# Patient Record
Sex: Male | Born: 1945 | Race: White | Hispanic: No | Marital: Married | Smoking: Never smoker
Health system: Southern US, Community
[De-identification: ages and names within clinical notes are randomized; demographics above are authoritative.]

## PROBLEM LIST (undated history)

## (undated) DIAGNOSIS — R55 Syncope and collapse: Secondary | ICD-10-CM

## (undated) DIAGNOSIS — R972 Elevated prostate specific antigen [PSA]: Secondary | ICD-10-CM

## (undated) DIAGNOSIS — G20 Parkinson's disease: Secondary | ICD-10-CM

## (undated) DIAGNOSIS — N400 Enlarged prostate without lower urinary tract symptoms: Secondary | ICD-10-CM

## (undated) DIAGNOSIS — G20A1 Parkinson's disease without dyskinesia, without mention of fluctuations: Secondary | ICD-10-CM

## (undated) DIAGNOSIS — E785 Hyperlipidemia, unspecified: Secondary | ICD-10-CM

## (undated) DIAGNOSIS — Z974 Presence of external hearing-aid: Secondary | ICD-10-CM

## (undated) DIAGNOSIS — F419 Anxiety disorder, unspecified: Secondary | ICD-10-CM

## (undated) HISTORY — PX: KNEE SURGERY: SHX244

## (undated) HISTORY — PX: HERNIA REPAIR: SHX51

---

## 2014-07-25 ENCOUNTER — Encounter: Payer: Self-pay | Admitting: Urgent Care

## 2014-07-25 DIAGNOSIS — Y9289 Other specified places as the place of occurrence of the external cause: Secondary | ICD-10-CM | POA: Diagnosis not present

## 2014-07-25 DIAGNOSIS — G20 Parkinson's disease: Secondary | ICD-10-CM | POA: Diagnosis not present

## 2014-07-25 DIAGNOSIS — Y998 Other external cause status: Secondary | ICD-10-CM | POA: Insufficient documentation

## 2014-07-25 DIAGNOSIS — W1839XA Other fall on same level, initial encounter: Secondary | ICD-10-CM | POA: Diagnosis not present

## 2014-07-25 DIAGNOSIS — S81811A Laceration without foreign body, right lower leg, initial encounter: Secondary | ICD-10-CM | POA: Insufficient documentation

## 2014-07-25 DIAGNOSIS — Y9389 Activity, other specified: Secondary | ICD-10-CM | POA: Insufficient documentation

## 2014-07-25 NOTE — ED Notes (Signed)
Patient presents with laceration to RLE; approx 1" just inferior to lateral aspect of knee. Patient reports that he fell and hit leg - could not stop bleeding. Slight oozing upon arrival. Dressing applied in triage.

## 2014-07-26 ENCOUNTER — Emergency Department
Admission: EM | Admit: 2014-07-26 | Discharge: 2014-07-26 | Disposition: A | Discharge status: Home / Self Care | Payer: Medicare Other | Attending: Emergency Medicine | Admitting: Emergency Medicine

## 2014-07-26 DIAGNOSIS — S81811A Laceration without foreign body, right lower leg, initial encounter: Secondary | ICD-10-CM

## 2014-07-26 DIAGNOSIS — W19XXXA Unspecified fall, initial encounter: Secondary | ICD-10-CM

## 2014-07-26 DIAGNOSIS — IMO0002 Reserved for concepts with insufficient information to code with codable children: Secondary | ICD-10-CM

## 2014-07-26 HISTORY — DX: Parkinson's disease: G20

## 2014-07-26 HISTORY — DX: Parkinson's disease without dyskinesia, without mention of fluctuations: G20.A1

## 2014-07-26 HISTORY — DX: Benign prostatic hyperplasia without lower urinary tract symptoms: N40.0

## 2014-07-26 MED ORDER — LIDOCAINE HCL (PF) 1 % IJ SOLN
INTRAMUSCULAR | Status: AC
  Administered 2014-07-26: 5 mL
  Filled 2014-07-26: qty 5

## 2014-07-26 MED ORDER — BACITRACIN 50000 UNITS IM SOLR
1.0000 "application " | Freq: Once | INTRAMUSCULAR | Status: AC
  Administered 2014-07-26: 1

## 2014-07-26 MED ORDER — BACITRACIN ZINC 500 UNIT/GM EX OINT
TOPICAL_OINTMENT | CUTANEOUS | Status: AC
  Filled 2014-07-26: qty 0.9

## 2014-07-26 MED ORDER — LIDOCAINE HCL (PF) 1 % IJ SOLN
5.0000 mL | Freq: Once | INTRAMUSCULAR | Status: AC
  Administered 2014-07-26: 5 mL

## 2014-07-26 NOTE — Discharge Instructions (Signed)

## 2014-07-26 NOTE — ED Provider Notes (Signed)
[facility] Emergency Department Provider Note  ____________________________________________  Time seen: Approximately 330 AM  I have reviewed the triage vital signs and the nursing notes.   HISTORY  Chief Complaint Fall and Extremity Laceration    HPI Johnny Cole is a 69 y.o. male who fell at home tonight and developed a laceration on his leg. The patient reports that because he has Parkinson's he falls often. He reports that he is getting appropriate and able with something in his hand and he fell. He reports that the bowl and his hand cut his leg. The patient has no pain in his knee but reports that he came in because he could not get the bleeding to stop. The patient reports that he did not his head did not pass out has not had any difficulty walking.   Past Medical History  Diagnosis Date  . Parkinson's disease   . BPH (benign prostatic hyperplasia)     There are no active problems to display for this patient.   Past Surgical History  Procedure Laterality Date  . Hernia repair      No current outpatient prescriptions on file.  Allergies Review of patient's allergies indicates no known allergies.  No family history on file.  Social History History  Substance Use Topics  . Smoking status: Never Smoker   . Smokeless tobacco: Not on file  . Alcohol Use: No    Review of Systems Constitutional: No fever/chills Eyes: No visual changes. ENT: No sore throat. Cardiovascular: Denies chest pain. Respiratory: Denies shortness of breath. Gastrointestinal: No abdominal pain.  No nausea, no vomiting.  No diarrhea.  No constipation. Genitourinary: Negative for dysuria. Musculoskeletal: Right leg pain Skin: Laceration Neurological: Negative for headaches, focal weakness or numbness.  10-point ROS otherwise negative.  ____________________________________________   PHYSICAL EXAM:  VITAL SIGNS: ED Triage Vitals  Enc Vitals Group     BP  07/25/14 2314 130/107 mmHg     Pulse Rate 07/25/14 2314 65     Resp 07/25/14 2314 16     Temp 07/25/14 2314 98.2 F (36.8 C)     Temp Source 07/25/14 2314 Oral     SpO2 07/25/14 2314 96 %     Weight 07/25/14 2314 206 lb (93.441 kg)     Height 07/25/14 2314  (1.702 m)     Head Cir --      Peak Flow --      Pain Score 07/25/14 2315 0     Pain Loc --      Pain Edu? --      Excl. in GC? --     Constitutional: Alert and oriented. Well appearing and in no acute distress. Eyes: Conjunctivae are normal. Left pupil 4 mm right pupil 3 mm EOMI. Head: Atraumatic. Nose: No congestion/rhinnorhea. Mouth/Throat: Mucous membranes are moist.  Oropharynx non-erythematous. Cardiovascular: Normal rate, regular rhythm. Grossly normal heart sounds.  Good peripheral circulation. Respiratory: Normal respiratory effort.  No retractions. Lungs CTAB. Gastrointestinal: Soft and nontender. No distention. No abdominal bruits. No CVA tenderness. Genitourinary: Deferred Musculoskeletal: Laceration to right lower extremity on right lateral shin below the knee. The laceration is approximately 3-4 cm in length. There is some mild bleeding coming from the area. Neurologic:  Normal speech and language. No gross focal neurologic deficits are appreciated.  Skin:  Skin is warm, dry and intact. No rash noted. Psychiatric: Mood and affect are normal.   ____________________________________________   LABS (all labs ordered are listed, but only  abnormal results are displayed)  Labs Reviewed - No data to display ____________________________________________  EKG  none ____________________________________________  RADIOLOGY  none ____________________________________________   PROCEDURES  Procedure(s) performed: Please, see procedure note(s).  LACERATION REPAIR Performed by: Lucrezia EuropeWebster,  Brayn Eckstein P Authorized by: Lucrezia EuropeWebster,  Uzma Hellmer P Consent: Verbal consent obtained. Risks and benefits: risks, benefits and  alternatives were discussed Consent given by: patient Patient identity confirmed: provided demographic data Prepped and Draped in normal sterile fashion Wound explored  Laceration Location: right lower extremity  Laceration Length: 3.5cm  No Foreign Bodies seen or palpated  Anesthesia: local infiltration  Local anesthetic: lidocaine 1% without epinephrine  Anesthetic total: 2 ml  Irrigation method: syringe Amount of cleaning: standard  Skin closure: 4.0 prolene  Number of sutures: 5  Technique: simple interrupted  Patient tolerance: Patient tolerated the procedure well with no immediate complications.   Critical Care performed: No  ____________________________________________   INITIAL IMPRESSION / ASSESSMENT AND PLAN / ED COURSE  Pertinent labs & imaging results that were available during my care of the patient were reviewed by me and considered in my medical decision making (see chart for details).  This is a 69 year old male who comes in tonight with a fall and a laceration to his right lower extremity. I did repair the wound with some sutures. The bleeding is controlled the patient has no other pain. I will discharge to home to follow-up with his primary care physician. He should have the sutures removed in 7-10 days. The patient has been instructed on reasons to return and he has no questions at this time. ____________________________________________   FINAL CLINICAL IMPRESSION(S) / ED DIAGNOSES  Final diagnoses:  Laceration  Leg laceration, right, initial encounter  Fall, initial encounter      Rebecka ApleyAllison P Calista Crain, MD 07/26/14 [phone]

## 2014-07-27 MED FILL — Bacitracin Intramuscular For Soln 50000 Unit: INTRAMUSCULAR | Qty: 1 | Status: AC

## 2016-01-03 ENCOUNTER — Ambulatory Visit: Payer: Medicare Other

## 2016-01-03 ENCOUNTER — Ambulatory Visit
Admission: EM | Admit: 2016-01-03 | Discharge: 2016-01-03 | Disposition: A | Discharge status: Home / Self Care | Payer: Medicare Other | Attending: Family Medicine | Admitting: Family Medicine

## 2016-01-03 ENCOUNTER — Encounter: Payer: Self-pay | Admitting: *Deleted

## 2016-01-03 DIAGNOSIS — S51011A Laceration without foreign body of right elbow, initial encounter: Secondary | ICD-10-CM | POA: Diagnosis present

## 2016-01-03 DIAGNOSIS — S40021A Contusion of right upper arm, initial encounter: Secondary | ICD-10-CM

## 2016-01-03 DIAGNOSIS — S41111A Laceration without foreign body of right upper arm, initial encounter: Secondary | ICD-10-CM | POA: Diagnosis not present

## 2016-01-03 DIAGNOSIS — W19XXXA Unspecified fall, initial encounter: Secondary | ICD-10-CM | POA: Insufficient documentation

## 2016-01-03 DIAGNOSIS — S51811A Laceration without foreign body of right forearm, initial encounter: Secondary | ICD-10-CM | POA: Diagnosis not present

## 2016-01-03 MED ORDER — CEPHALEXIN 500 MG PO CAPS: 500.0000 mg | ORAL_CAPSULE | Freq: Three times a day (TID) | ORAL | 0 refills | Status: AC

## 2016-01-03 MED ORDER — LIDOCAINE HCL (PF) 1 % IJ SOLN: 15.0000 mL | Freq: Once | INTRAMUSCULAR | Status: DC

## 2016-01-03 MED ORDER — TETANUS-DIPHTH-ACELL PERTUSSIS 5-2.5-18.5 LF-MCG/0.5 IM SUSP
0.5000 mL | Freq: Once | INTRAMUSCULAR | Status: AC
  Administered 2016-01-03: 0.5 mL via INTRAMUSCULAR

## 2016-01-03 NOTE — Discharge Instructions (Signed)
Take medication as prescribed. Rest. Elevate. Keep clean as discussed.   Follow up with your primary care physician in 2 days for wound check.Follow up with Primary care or Urgent care in 10 days for suture removal.   Return to Urgent care for new or worsening concerns.

## 2016-01-03 NOTE — ED Triage Notes (Signed)
Patient feel this AM lacerating his right arm just below the elbow. Patient has a history of falls due to parkinson's.

## 2016-01-03 NOTE — ED Provider Notes (Signed)
[facility] ____________________________________________  Time seen: Approximately 12:05 PM  I have reviewed the triage vital signs and the nursing notes.   HISTORY  Chief Complaint Laceration   HPI Johnny Cole is a 70 y.o. male with a history of frequent falls second to Parkinson's and unsteady gait, presenting for the complaint of right arm laceration after a fall this morning. Patient and wife reports that patient was walking in his house but did not have his walker in the correct way, lost his balance and fell backwards. Patient states he often falls, and states he falls almost daily. Patient reports he often falls backwards due to his gait. Patient denies any head injury or loss of consciousness. Patient states that he fell on his buttocks and low back. Denies any back or neck pain or pain to buttocks. Patient states that as he fell he hit his right elbow along the door frame which caused a laceration.  Patient states minimal pain to right arm. Again denies head injury or loss of consciousness. Patient reports wife was nearby. Patient reports today's fall was consistent with his chronic and frequent falls. Denies chest pain, shortness of breath, abdominal pain, dysuria, extremity pain, extremity swelling, dizziness, vision changes, headache or other complaints. Reports last tetanus shot approximately 10 years ago, but unsure exactly.  Rolm Gala, MD: PCP   Past Medical History:  Diagnosis Date  . BPH (benign prostatic hyperplasia)   . Parkinson's disease (HCC)     There are no active problems to display for this patient.   Past Surgical History:  Procedure Laterality Date  . HERNIA REPAIR      Current Outpatient Rx  . Order #: [id number] Class: Historical Med  . Order #: [id number] Class: Historical Med  . Order #: [id number] Class: Historical Med  . Order #: [id number] Class: Historical Med  . Order #: [id number] Class: Historical Med  . Order #:  [id number] Class: Historical Med  . Order #: [id number] Class: Normal    No current facility-administered medications for this encounter.   Current Outpatient Prescriptions:  .  carbidopa-levodopa (SINEMET IR) 25-250 MG tablet, Take 1 tablet by mouth 3 (three) times daily., Disp: , Rfl:  .  Carbidopa-Levodopa ER (SINEMET CR) 25-100 MG tablet controlled release, Take 1 tablet by mouth 2 (two) times daily., Disp: , Rfl:  .  citalopram (CELEXA) 20 MG tablet, Take 20 mg by mouth daily., Disp: , Rfl:  .  finasteride (PROSCAR) 5 MG tablet, Take 5 mg by mouth daily., Disp: , Rfl:  .  rOPINIRole (REQUIP) 0.5 MG tablet, Take 0.5 mg by mouth 3 (three) times daily., Disp: , Rfl:  .  simvastatin (ZOCOR) 20 MG tablet, Take 20 mg by mouth daily at 6 PM., Disp: , Rfl:  .  cephALEXin (KEFLEX) 500 MG capsule, Take 1 capsule (500 mg total) by mouth 3 (three) times daily., Disp: 15 capsule, Rfl: 0  Allergies Patient has no known allergies.  History reviewed. No pertinent family history.  Social History Social History  Substance Use Topics  . Smoking status: Never Smoker  . Smokeless tobacco: Never Used  . Alcohol use No    Review of Systems Constitutional: No fever/chills Eyes: No visual changes. ENT: No sore throat. Cardiovascular: Denies chest pain. Respiratory: Denies shortness of breath. Gastrointestinal: No abdominal pain.  No nausea, no vomiting.  No diarrhea.  No constipation. Genitourinary: Negative for dysuria. Musculoskeletal: Negative for back pain. Skin: Negative for rash. As above.  Neurological: Negative for headaches, focal weakness or numbness.  10-point  ROS otherwise negative.  ____________________________________________   PHYSICAL EXAM:  VITAL SIGNS: ED Triage Vitals  Enc Vitals Group     BP 01/03/16 1101 (!) 126/58     Pulse Rate 01/03/16 1101 65     Resp 01/03/16 1101 14     Temp 01/03/16 1101 97.7 F (36.5 C)     Temp Source 01/03/16 1101 Oral     SpO2 01/03/16  1101 98 %     Weight 01/03/16 1102 209 lb (94.8 kg)     Height 01/03/16 1102 5\' 7"  (1.702 m)     Head Circumference --      Peak Flow --      Pain Score 01/03/16 1107 2     Pain Loc --      Pain Edu? --      Excl. in GC? --     Constitutional: Alert and oriented. Well appearing and in no acute distress. Eyes: Conjunctivae are normal. PERRL. EOMI. ENT      Head: Normocephalic and atraumatic.      Mouth/Throat: Mucous membranes are moist. Cardiovascular: Normal rate, regular rhythm. Grossly normal heart sounds.  Good peripheral circulation. Respiratory: Normal respiratory effort without tachypnea nor retractions. Breath sounds are clear and equal bilaterally. No wheezes/rales/rhonchi.. Gastrointestinal: Soft and nontender. No distention.  Musculoskeletal: No midline cervical, thoracic or lumbar tenderness to palpation. Bilateral pedal pulses equal and easily palpated.      Right lower leg:  No tenderness or edema.      Left lower leg:  No tenderness or edema.  Neurologic:  Normal speech and language. No gross focal neurologic deficits are appreciated. Speech is normal. No gait instability.  Skin:  Skin is warm, dry and intact. No rash noted. Except: right medial proximal forearm just distal of elbow large flap laceration approximately 10 cm, no foreign bodies, no tendon visualized, no bone visualized, full range of motion, and minimal active bleeding, minimal tenderness to palpation, no motor or tendon deficit, normal sensation, bilateral hand grips strong and equal, bilateral distal radial pulses strong and equal, no surrounding erythema. Psychiatric: Mood and affect are normal. Speech and behavior are normal. Patient exhibits appropriate insight and judgment   ___________________________________________   LABS (all labs ordered are listed, but only abnormal results are displayed)  Labs Reviewed - No data to display ____________________________________________  RADIOLOGY  Dg Elbow  Complete Right  Result Date: 01/03/2016 CLINICAL DATA:  Fall, right elbow pain and laceration, initial encounter. EXAM: RIGHT ELBOW - COMPLETE 3+ VIEW COMPARISON:  None. FINDINGS: Large laceration is seen along the medial aspect of the proximal forearm. No radiopaque foreign body or fracture. IMPRESSION: Large proximal forearm laceration without fracture or foreign body. Electronically Signed   By: Leanna BattlesMelinda  Blietz M.D.   On: 01/03/2016 12:33   Dg Forearm Right  Result Date: 01/03/2016 CLINICAL DATA:  Fall, right elbow pain and laceration, initial encounter. EXAM: RIGHT FOREARM - 2 VIEW COMPARISON:  None. FINDINGS: A large laceration is seen along the medial aspect of the proximal forearm. No radiopaque foreign body or fracture. IMPRESSION: Large proximal forearm laceration without radiopaque foreign body or fracture. Electronically Signed   By: Leanna BattlesMelinda  Blietz M.D.   On: 01/03/2016 12:34   ____________________________________________   PROCEDURES Procedures   Procedure(s) performed:  Procedure explained and verbal consent obtained. Consent: Verbal consent obtained. Written consent not obtained. Risks and benefits: risks, benefits and alternatives were discussed Patient identity confirmed: verbally with patient and hospital-assigned identification number  Consent given by:  patient   Laceration Repair Location: right forearm Length: 10 cm Foreign bodies: no foreign bodies Tendon involvement: none Nerve involvement: none Preparation: Patient was prepped and draped in the usual sterile fashion. Anesthesia with 1% Lidocaine 10 mls Irrigation solution: saline and betadine Irrigation method: jet lavage Amount of cleaning: copious X3 4-0 absorbable vicryl sutures placed Repaired with 3-0 nylon  Number of sutures: 14 simple, 1 mattress suture at medial flap Approximation: loose Patient tolerate well. Wound well approximated post repair.  Antibiotic ointment and dressing applied.  Wound  care instructions provided.  Observe for any signs of infection or other problems.      INITIAL IMPRESSION / ASSESSMENT AND PLAN / ED COURSE  Pertinent labs & imaging results that were available during my care of the patient were reviewed by me and considered in my medical decision making (see chart for details).  Well-appearing patient. No acute distress. Presents for the complaints of a large laceration to right forearm post fall. Patient with Parkinson's and history of chronic frequent falls. Denies other pain or injury. Right elbow and right forearm laceration per radiologist negative for acute bony abnormality or foreign bodies. Laceration copiously irrigated and repaired. Patient tolerated well. Discussed strict follow-up and return parameters. Encouraged wound evaluation and check in 2 days. Will initiate oral Keflex due to large laceration. Tetanus immunization updated. Discussed wound cleaning, elevation and monitoring. Discussed suture removal in 10-14 days.Discussed indication, risks and benefits of medications with patient   Discussed follow up with Primary care physician this week. Discussed follow up and return parameters including no resolution or any worsening concerns. Patient verbalized understanding and agreed to plan.   ____________________________________________   FINAL CLINICAL IMPRESSION(S) / ED DIAGNOSES  Final diagnoses:  Arm laceration, right, initial encounter  Arm contusion, right, initial encounter  Fall, initial encounter     Discharge Medication List as of 01/03/2016  1:51 PM    START taking these medications   Details  cephALEXin (KEFLEX) 500 MG capsule Take 1 capsule (500 mg total) by mouth 3 (three) times daily., Starting Fri 01/03/2016, Until Wed 01/08/2016, Normal        Note: This dictation was prepared with Dragon dictation along with smaller phrase technology. Any transcriptional errors that result from this process are unintentional.     Clinical Course       Renford DillsLindsey Matricia Begnaud, NP 01/12/16 [phone]

## 2016-03-15 ENCOUNTER — Ambulatory Visit
Admission: EM | Admit: 2016-03-15 | Discharge: 2016-03-15 | Disposition: A | Discharge status: Home / Self Care | Payer: Medicare Other | Attending: Family Medicine | Admitting: Family Medicine

## 2016-03-15 ENCOUNTER — Encounter: Payer: Self-pay | Admitting: Gynecology

## 2016-03-15 DIAGNOSIS — S0181XA Laceration without foreign body of other part of head, initial encounter: Secondary | ICD-10-CM

## 2016-03-15 HISTORY — DX: Hyperlipidemia, unspecified: E78.5

## 2016-03-15 HISTORY — DX: Anxiety disorder, unspecified: F41.9

## 2016-03-15 HISTORY — DX: Elevated prostate specific antigen (PSA): R97.20

## 2016-03-15 MED ORDER — MUPIROCIN 2 % EX OINT: 1.0000 "application " | TOPICAL_OINTMENT | Freq: Three times a day (TID) | CUTANEOUS | 0 refills | Status: DC

## 2016-03-15 NOTE — ED Triage Notes (Signed)
Per patient fell at the [employer] while going to the rest room. Per patient happen around 8 PM. Patient present with laceration above left upper eye.

## 2016-03-15 NOTE — ED Provider Notes (Signed)
CSN: [id number]     Arrival date & time 03/15/16  [id number] History   First MD Initiated Contact with Patient 03/15/16 [phone]     Chief Complaint  Patient presents with  . Laceration   (Consider location/radiation/quality/duration/timing/severity/associated sxs/prior Treatment) HPI  This a 71 year old male who last night approximately 8 PM while going to the restroom [employer] of fell and hit his face had sustained a laceration above his left eye. She had no loss of consciousness. No headache or problems with concentration. He states he is not taking anti-coagulant medications. He Presents today because of the laceration. Is a 1 cm laceration just below the eyebrow on the left extending to the upper lid but does not penetrate the lid. He is current on his tetanus toxoid. He denies any visual disturbances. Does use a walker for ambulatory assistance. Review of his records he has had multiple falls recently due to Parkinson's and mobility problems.       Past Medical History:  Diagnosis Date  . Anxiety   . BPH (benign prostatic hyperplasia)   . Elevated PSA   . Hyperlipidemia   . Parkinson's disease [facility](HCC)    Past Surgical History:  Procedure Laterality Date  . HERNIA REPAIR     umbilical  . KNEE SURGERY     No family history on file. Social History  Substance Use Topics  . Smoking status: Never Smoker  . Smokeless tobacco: Never Used  . Alcohol use No    Review of Systems  Constitutional: Positive for activity change. Negative for chills, fatigue and fever.  Musculoskeletal: Positive for gait problem.  Skin: Positive for wound.  All other systems reviewed and are negative.   Allergies  Patient has no known allergies.  Home Medications   Prior to Admission medications   Medication Sig Start Date End Date Taking? Authorizing Provider  carbidopa-levodopa (SINEMET IR) 25-250 MG tablet Take 1 tablet by mouth 3 (three) times daily.   Yes Historical Provider, MD   Carbidopa-Levodopa ER (SINEMET CR) 25-100 MG tablet controlled release Take 1 tablet by mouth 2 (two) times daily.   Yes Historical Provider, MD  citalopram (CELEXA) 20 MG tablet Take 20 mg by mouth daily.   Yes Historical Provider, MD  finasteride (PROSCAR) 5 MG tablet Take 5 mg by mouth daily.   Yes Historical Provider, MD  rOPINIRole (REQUIP) 0.5 MG tablet Take 0.5 mg by mouth 3 (three) times daily.   Yes Historical Provider, MD  simvastatin (ZOCOR) 20 MG tablet Take 20 mg by mouth daily at 6 PM.   Yes Historical Provider, MD  mupirocin ointment (BACTROBAN) 2 % Apply 1 application topically 3 (three) times daily. 03/15/16   Lutricia FeilWilliam P Stepahnie Campo, PA-C   Meds Ordered and Administered this Visit  Medications - No data to display  BP 112/71 (BP Location: Right Arm)   Pulse 73   Temp 98.7 F (37.1 C) (Oral)   Resp 16   Wt 205 lb (93 kg)   SpO2 97%   BMI 32.11 kg/m  No data found.   Physical Exam  Constitutional: He is oriented to person, place, and time. He appears well-developed and well-nourished. No distress.  HENT:  Head: Normocephalic and atraumatic.  Eyes: Right eye exhibits no discharge. Left eye exhibits no discharge.  Examination of the left eye shows the pupil larger than the right but the patient states that this is normal from previous injury. The are as recorded. There is a 1 cm shallow laceration just on  the upper eyelid just below the left eyebrow laterally. Abrasion in the eyebrow itself. I refer you to the pictures for further detail. EOMs are full. No palpable defect of the orbit.  Neck: Normal range of motion. Neck supple.  Neurological: He is alert and oriented to person, place, and time.  Skin: Skin is warm and dry. He is not diaphoretic.  Psychiatric: He has a normal mood and affect. His behavior is normal. Judgment and thought content normal.  Nursing note and vitals reviewed.   Urgent Care Course     .Marland KitchenLaceration Repair Date/Time: 03/15/2016 9:47 AM Performed  by: Lutricia Feil Authorized by: Tommie Sams   Consent:    Consent obtained:  Verbal   Consent given by:  Patient   Risks discussed:  Pain, poor cosmetic result, infection and poor wound healing   Alternatives discussed:  No treatment, observation and referral Anesthesia (see MAR for exact dosages):    Anesthesia method:  Local infiltration   Local anesthetic:  Lidocaine 1% w/o epi Laceration details:    Location:  Face   Face location:  L upper eyelid   Extent:  Superficial   Length (cm):  1   Depth (mm):  1 Repair type:    Repair type:  Simple Pre-procedure details:    Preparation:  Patient was prepped and draped in usual sterile fashion Exploration:    Hemostasis achieved with:  Direct pressure   Wound extent: areolar tissue violated     Contaminated: no   Treatment:    Area cleansed with:  Betadine and saline   Amount of cleaning:  Standard   Irrigation method:  Tap   Visualized foreign bodies/material removed: no   Skin repair:    Repair method:  Sutures   Suture size:  4-0   Number of sutures:  2 Approximation:    Approximation:  Loose   Vermilion border: well-aligned   Post-procedure details:    Dressing:  Antibiotic ointment   Patient tolerance of procedure:  Tolerated well, no immediate complications    (including critical care time)  Labs Review Labs Reviewed - No data to display  Imaging Review No results found.   Visual Acuity Review  Right Eye Distance: 20/30 (with corrective lens) Left Eye Distance: 20/30 (with corrective lens) Bilateral Distance: 20/25 (with corrective lens)  Right Eye Near:   Left Eye Near:    Bilateral Near:         MDM   1. Laceration of forehead, initial encounter    Discharge Medication List as of 03/15/2016  9:38 AM    START taking these medications   Details  mupirocin ointment (BACTROBAN) 2 % Apply 1 application topically 3 (three) times daily., Starting Sun 03/15/2016, Normal      Plan: 1.  Test/x-ray results and diagnosis reviewed with patient 2. rx as per orders; risks, benefits, potential side effects reviewed with patient 3. Recommend supportive treatment with keeping dry 24 hours. Apply Bactroban sparingly to wound times daily avoiding the eye. Suture removal in 5 days. 4. F/u prn if symptoms worsen or don't improve     Lutricia Feil, PA-C 03/15/16 1730

## 2016-03-20 ENCOUNTER — Ambulatory Visit
Admission: EM | Admit: 2016-03-20 | Discharge: 2016-03-20 | Disposition: A | Discharge status: Home / Self Care | Payer: Medicare Other

## 2016-03-20 ENCOUNTER — Encounter: Payer: Self-pay | Admitting: *Deleted

## 2016-03-20 DIAGNOSIS — Z4802 Encounter for removal of sutures: Secondary | ICD-10-CM

## 2016-03-20 NOTE — ED Triage Notes (Signed)
Suture removal

## 2017-11-06 IMAGING — CR DG FOREARM 2V*R*
2 series · 2 of 2 positions shown · non-contrast
Comparison: None.

CLINICAL DATA: Fall, right elbow pain and laceration, initial
encounter.

EXAM:
RIGHT FOREARM - 2 VIEW

[forearm ap]
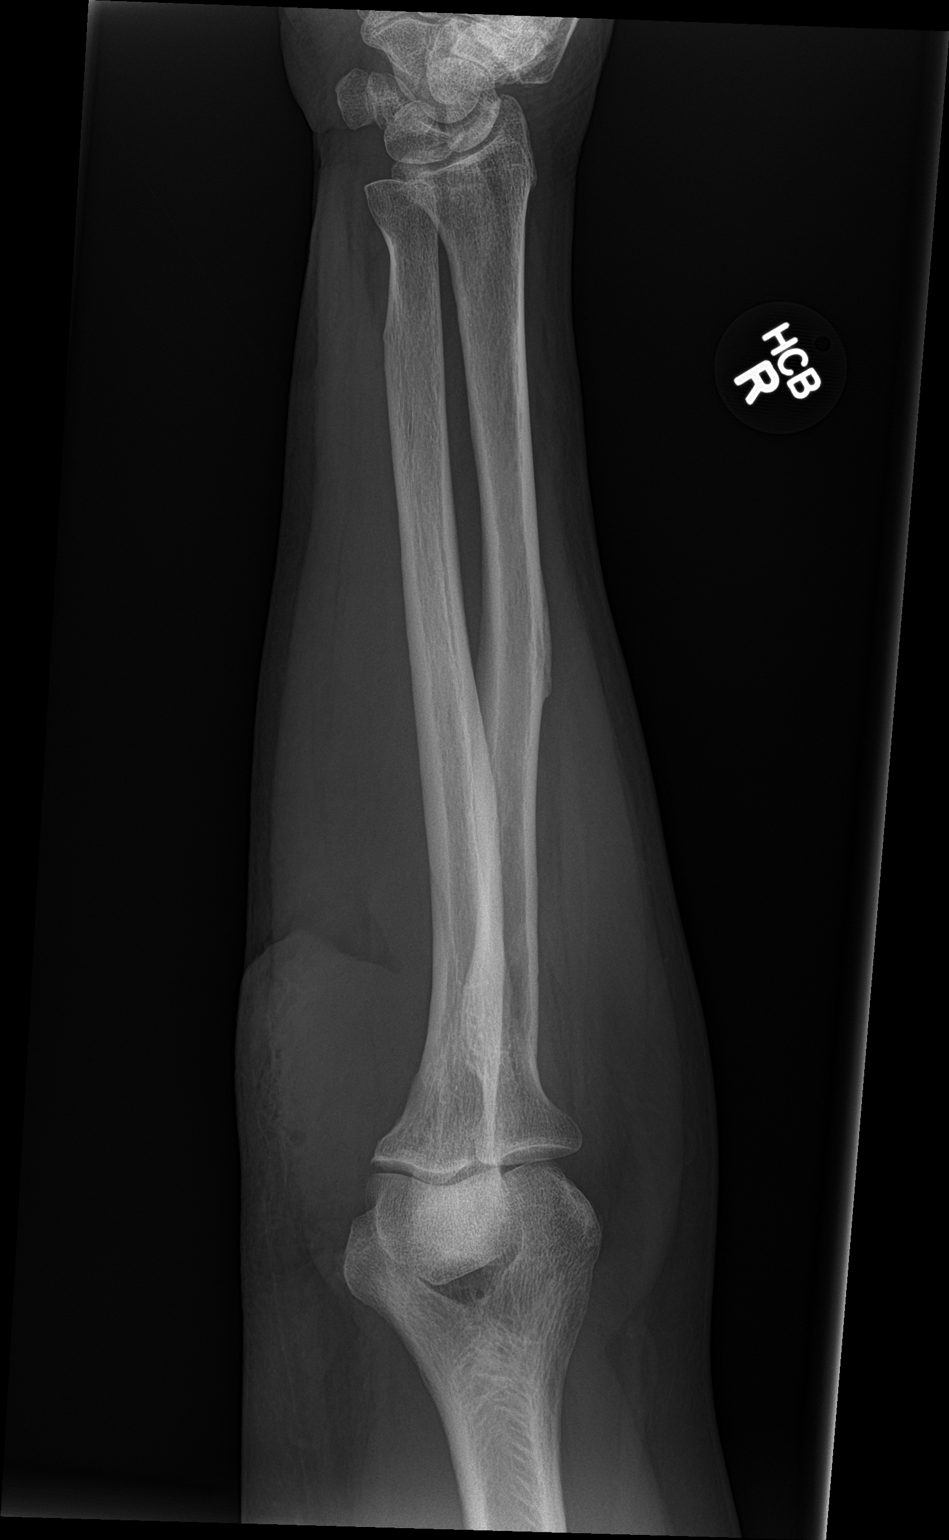

[forearm lat]
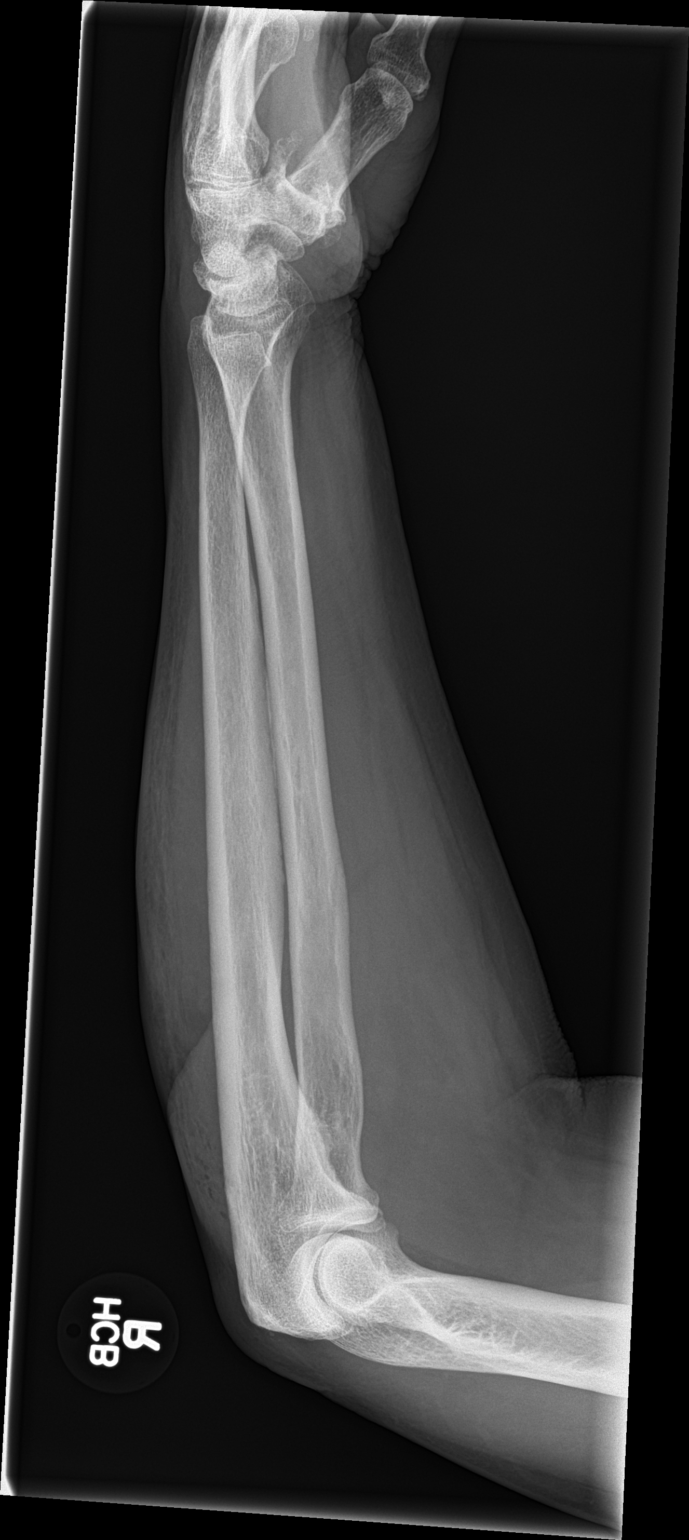

[2 of 2 positions shown; findings below may reference images not displayed]

FINDINGS: A large laceration is seen along the medial aspect of the proximal
forearm. No radiopaque foreign body or fracture.
IMPRESSION: Large proximal forearm laceration without radiopaque foreign body or
fracture.

## 2017-11-06 IMAGING — CR DG ELBOW COMPLETE 3+V*R*
4 series · 4 of 4 positions shown · non-contrast
Comparison: None.

CLINICAL DATA: Fall, right elbow pain and laceration, initial
encounter.

EXAM:
RIGHT ELBOW - COMPLETE 3+ VIEW

[elbow ap]
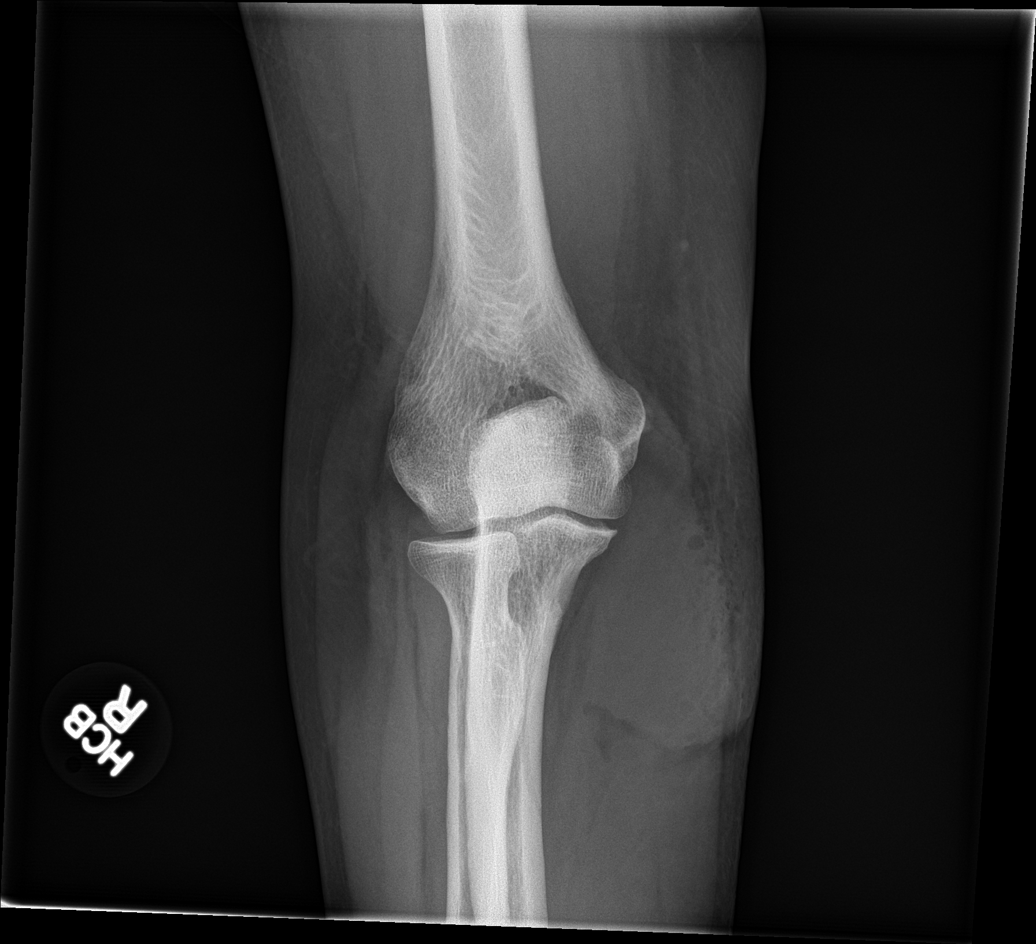

[elbow obl (1 of 2)]
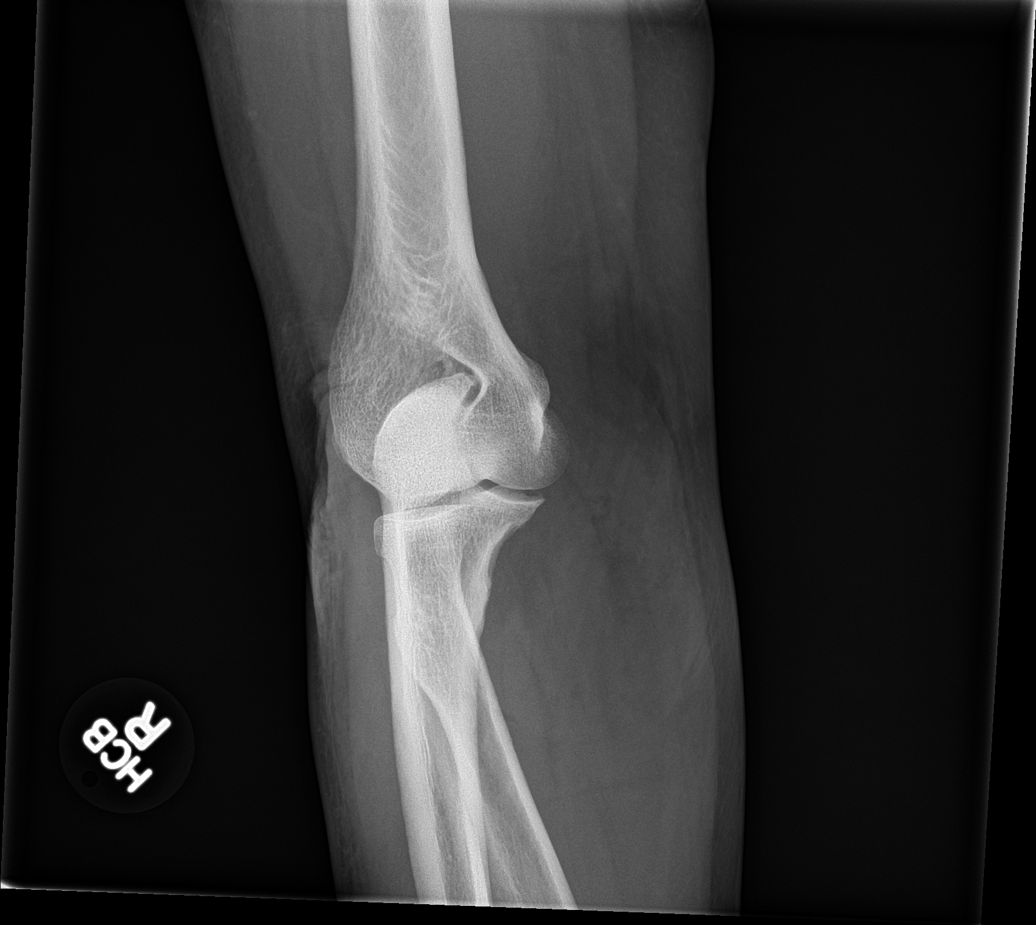

[elbow obl (2 of 2)]
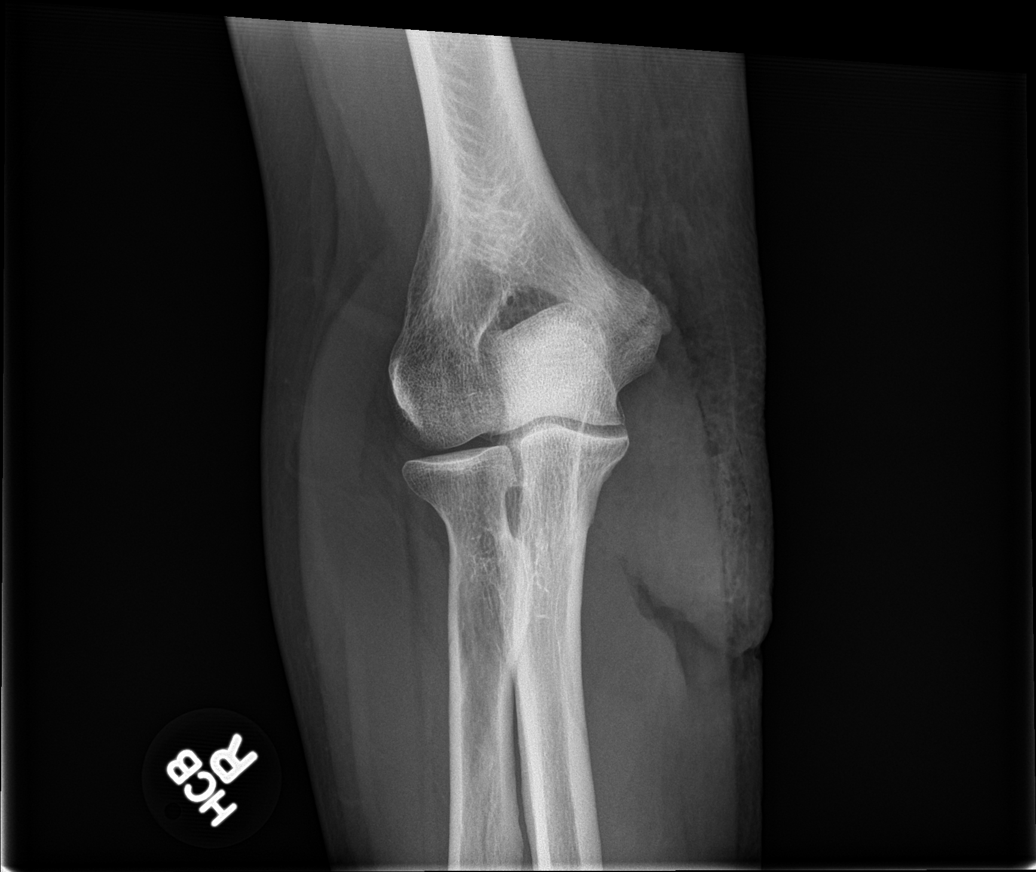

[elbow lat]
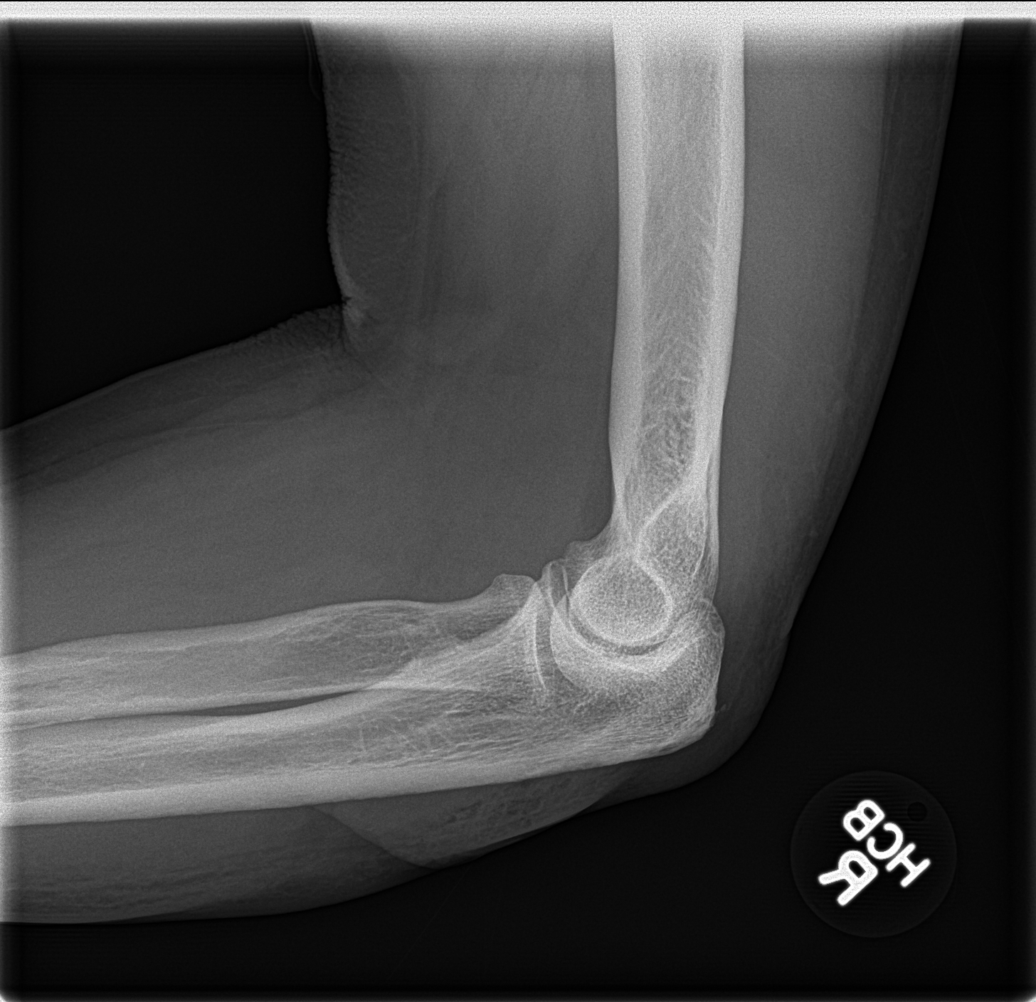

[4 of 4 positions shown; findings below may reference images not displayed]

FINDINGS: Large laceration is seen along the medial aspect of the proximal
forearm. No radiopaque foreign body or fracture.
IMPRESSION: Large proximal forearm laceration without fracture or foreign body.

## 2019-06-20 ENCOUNTER — Other Ambulatory Visit: Payer: Self-pay

## 2019-06-20 ENCOUNTER — Encounter: Payer: Self-pay | Admitting: Emergency Medicine

## 2019-06-20 ENCOUNTER — Ambulatory Visit
Admission: EM | Admit: 2019-06-20 | Discharge: 2019-06-20 | Disposition: A | Discharge status: Home / Self Care | Payer: Medicare Other | Attending: Family Medicine | Admitting: Family Medicine

## 2019-06-20 ENCOUNTER — Ambulatory Visit (INDEPENDENT_AMBULATORY_CARE_PROVIDER_SITE_OTHER)
Admit: 2019-06-20 | Discharge: 2019-06-20 | Disposition: A | Discharge status: Home / Self Care | Payer: Medicare Other | Attending: Family Medicine | Admitting: Family Medicine

## 2019-06-20 DIAGNOSIS — S0990XA Unspecified injury of head, initial encounter: Secondary | ICD-10-CM

## 2019-06-20 DIAGNOSIS — S0101XA Laceration without foreign body of scalp, initial encounter: Secondary | ICD-10-CM

## 2019-06-20 NOTE — ED Triage Notes (Signed)
Patient here for fall about 1 hour ago. States he fell and hit his head on the counter in the kitchen. Laceration to the back of his head. Patient denies LOC. He states he has Parkinson's and falls frequently. Patient reports he is up to date on his Tetanus.

## 2019-06-20 NOTE — Discharge Instructions (Signed)
Return in 7 days for staple removal.  Take care  Dr. Adriana Simas

## 2019-06-20 NOTE — ED Provider Notes (Signed)
[facility]    CSN: [id number] Arrival date & time: 06/20/19  1815  History   Chief Complaint Chief Complaint  Patient presents with  . Fall  . Head Injury   HPI  74 year old male presents with the above complaints.  Patient has Parkinson's.  He was unsteady on his feet and fell back striking the back of his head on the corner of the kitchen counter.  He has a laceration to the parietal scalp.  Bleeding well controlled.  Denies loss of conscious.  No vision changes.  No nausea or vomiting.  He is not on any antiplatelet agents or anticoagulants.  No reported weakness other than his baseline.  No other associated symptoms.  No other complaints.  Past Medical History:  Diagnosis Date  . Anxiety   . BPH (benign prostatic hyperplasia)   . Elevated PSA   . Hyperlipidemia   . Parkinson's disease [facility])    Past Surgical History:  Procedure Laterality Date  . HERNIA REPAIR     umbilical  . KNEE SURGERY      Home Medications    Prior to Admission medications   Medication Sig Start Date End Date Taking? Authorizing Provider  carbidopa-levodopa (SINEMET IR) 25-250 MG tablet Take 1 tablet by mouth 3 (three) times daily.   Yes [provider]  citalopram (CELEXA) 20 MG tablet Take 20 mg by mouth daily.   Yes [provider]  finasteride (PROSCAR) 5 MG tablet Take 5 mg by mouth daily.   Yes [provider]  rOPINIRole (REQUIP) 0.5 MG tablet Take 0.5 mg by mouth 3 (three) times daily.   Yes [provider]  simvastatin (ZOCOR) 20 MG tablet Take 20 mg by mouth daily at 6 PM.   Yes [provider]  Carbidopa-Levodopa ER (SINEMET CR) 25-100 MG tablet controlled release Take 1 tablet by mouth 2 (two) times daily.    [provider]  mupirocin ointment (BACTROBAN) 2 % Apply 1 application topically 3 (three) times daily. 03/15/16   Lutricia Feil, PA-C   Social History Social History   Tobacco Use  . Smoking status: Never  Smoker  . Smokeless tobacco: Never Used  Substance Use Topics  . Alcohol use: No  . Drug use: No     Allergies   Patient has no known allergies.   Review of Systems Review of Systems  HENT:       Head injury.  Skin: Positive for wound.   Physical Exam Triage Vital Signs ED Triage Vitals  Enc Vitals Group     BP 06/20/19 1847 (!) 141/76     Pulse Rate 06/20/19 1847 63     Resp 06/20/19 1847 18     Temp 06/20/19 1847 98.2 F (36.8 C)     Temp Source 06/20/19 1847 Oral     SpO2 06/20/19 1847 98 %     Weight 06/20/19 1845 210 lb (95.3 kg)     Height 06/20/19 1845 5\' 7"  (1.702 m)     Head Circumference --      Peak Flow --      Pain Score 06/20/19 1845 2     Pain Loc --      Pain Edu? --      Excl. in GC? --    Updated Vital Signs BP (!) 141/76 (BP Location: Right Arm)   Pulse 63   Temp 98.2 F (36.8 C) (Oral)   Resp 18   Ht 5\' 7"  (1.702 m)  Wt 95.3 kg   SpO2 98%   BMI 32.89 kg/m   Visual Acuity Right Eye Distance:   Left Eye Distance:   Bilateral Distance:    Right Eye Near:   Left Eye Near:    Bilateral Near:     Physical Exam Vitals and nursing note reviewed.  Constitutional:      General: He is not in acute distress.    Appearance: Normal appearance. He is not ill-appearing.  HENT:     Head:      Comments: Laceration noted to the labelled location. ~ 5 cm.    Nose: Nose normal.  Eyes:     Conjunctiva/sclera: Conjunctivae normal.     Comments: Anisocoria noted.  Pupils reactive.  Cardiovascular:     Rate and Rhythm: Normal rate and regular rhythm.  Pulmonary:     Effort: Pulmonary effort is normal. No respiratory distress.  Neurological:     General: No focal deficit present.     Mental Status: He is alert.  Psychiatric:        Mood and Affect: Mood normal.        Behavior: Behavior normal.    UC Treatments / Results  Labs (all labs ordered are listed, but only abnormal results are displayed) Labs Reviewed - No data to  display  EKG   Radiology CT Head Wo Contrast  Result Date: 06/20/2019 CLINICAL DATA:  Fall 1 hour prior with posterior head laceration. Parkinson disease. EXAM: CT HEAD WITHOUT CONTRAST TECHNIQUE: Contiguous axial images were obtained from the base of the skull through the vertex without intravenous contrast. COMPARISON:  None. FINDINGS: Brain: No evidence of parenchymal hemorrhage or extra-axial fluid collection. No mass lesion, mass effect, or midline shift. No CT evidence of acute infarction. Generalized mild cerebral volume loss. No ventriculomegaly. Vascular: No acute abnormality. Skull: No evidence of calvarial fracture. Left parietal scalp staples. Sinuses/Orbits: The visualized paranasal sinuses are essentially clear. Other:  The mastoid air cells are unopacified. IMPRESSION: 1. Left parietal scalp staples. No evidence of calvarial fracture. 2. No evidence of acute intracranial abnormality. 3. Generalized mild cerebral volume loss. Electronically Signed   By: Ilona Sorrel M.D.   On: 06/20/2019 19:56    Procedures Laceration Repair  Date/Time: 06/20/2019 8:14 PM Performed by: Coral Spikes, DO Authorized by: Coral Spikes, DO   Consent:    Consent obtained:  Verbal   Consent given by:  Patient Anesthesia (see MAR for exact dosages):    Anesthesia method:  Local infiltration   Local anesthetic:  Lidocaine 1% WITH epi Laceration details:    Location:  Scalp   Scalp location:  L parietal   Length (cm):  5 Repair type:    Repair type:  Simple Pre-procedure details:    Preparation:  Patient was prepped and draped in usual sterile fashion Exploration:    Hemostasis achieved with:  Direct pressure   Contaminated: no   Treatment:    Area cleansed with:  Betadine Skin repair:    Repair method:  Staples   Number of staples:  5 Approximation:    Approximation:  Close Post-procedure details:    Patient tolerance of procedure:  Tolerated well, no immediate complications    (including critical care time)  Medications Ordered in UC Medications - No data to display  Initial Impression / Assessment and Plan / UC Course  I have reviewed the triage vital signs and the nursing notes.  Pertinent labs & imaging results that were available during  my care of the patient were reviewed by me and considered in my medical decision making (see chart for details).    74 year old male presents with minor head injury and laceration.  CT obtained and was negative for acute findings.  Laceration repaired as above.  Sutures out in 7 days.  Supportive care.  Final Clinical Impressions(s) / UC Diagnoses   Final diagnoses:  Minor head injury, initial encounter  Laceration of scalp, initial encounter     Discharge Instructions     Return in 7 days for staple removal.  Take care  Dr. Adriana Simas    ED Prescriptions    None     PDMP not reviewed this encounter.   Tommie Sams, [place] 06/20/19 2015

## 2019-06-27 ENCOUNTER — Other Ambulatory Visit: Payer: Self-pay

## 2019-06-27 ENCOUNTER — Ambulatory Visit
Admission: EM | Admit: 2019-06-27 | Discharge: 2019-06-27 | Disposition: A | Discharge status: Home / Self Care | Payer: Medicare Other | Attending: Emergency Medicine | Admitting: Emergency Medicine

## 2019-06-27 NOTE — ED Triage Notes (Signed)
Patient here for staple removal. 5 staples removed from scalp. Patient tolerated well.

## 2020-08-15 ENCOUNTER — Encounter: Payer: Self-pay | Admitting: Ophthalmology

## 2020-08-23 NOTE — Discharge Instructions (Signed)

## 2020-08-24 NOTE — Anesthesia Preprocedure Evaluation (Addendum)
Anesthesia Evaluation  Patient identified by MRN, date of birth, ID band Patient awake    Reviewed: Allergy & Precautions, NPO status , Patient's Chart, lab work & pertinent test results  History of Anesthesia Complications Negative for: history of anesthetic complications  Airway Mallampati: IV   Neck ROM: Full    Dental no notable dental hx.    Pulmonary neg pulmonary ROS,    Pulmonary exam normal breath sounds clear to auscultation       Cardiovascular Exercise Tolerance: Good negative cardio ROS Normal cardiovascular exam Rhythm:Regular Rate:Normal     Neuro/Psych PSYCHIATRIC DISORDERS Anxiety HOH  Neuromuscular disease (Parkinson disease)    GI/Hepatic negative GI ROS,   Endo/Other  negative endocrine ROS  Renal/GU negative Renal ROS     Musculoskeletal   Abdominal   Peds  Hematology negative hematology ROS (+)   Anesthesia Other Findings BPH  Reproductive/Obstetrics                            Anesthesia Physical Anesthesia Plan  ASA: 3  Anesthesia Plan: MAC   Post-op Pain Management:    Induction: Intravenous  PONV Risk Score and Plan: 1 and TIVA, Midazolam and Treatment may vary due to age or medical condition  Airway Management Planned: Nasal Cannula  Additional Equipment:   Intra-op Plan:   Post-operative Plan:   Informed Consent: I have reviewed the patients History and Physical, chart, labs and discussed the procedure including the risks, benefits and alternatives for the proposed anesthesia with the patient or authorized representative who has indicated his/her understanding and acceptance.       Plan Discussed with: CRNA  Anesthesia Plan Comments:       Anesthesia Quick Evaluation

## 2020-08-26 ENCOUNTER — Ambulatory Visit
Admission: RE | Admit: 2020-08-26 | Discharge: 2020-08-26 | Disposition: A | Discharge status: Home / Self Care | Payer: Medicare Other | Attending: Ophthalmology | Admitting: Ophthalmology

## 2020-08-26 ENCOUNTER — Ambulatory Visit: Payer: Medicare Other | Admitting: Anesthesiology

## 2020-08-26 ENCOUNTER — Encounter: Payer: Self-pay | Admitting: Ophthalmology

## 2020-08-26 ENCOUNTER — Encounter
Admission: RE | Disposition: A | Discharge status: Home / Self Care | Payer: Self-pay | Source: Home / Self Care | Attending: Ophthalmology

## 2020-08-26 ENCOUNTER — Other Ambulatory Visit: Payer: Self-pay

## 2020-08-26 DIAGNOSIS — Z79899 Other long term (current) drug therapy: Secondary | ICD-10-CM | POA: Diagnosis not present

## 2020-08-26 DIAGNOSIS — G20 Parkinson's disease: Secondary | ICD-10-CM | POA: Diagnosis not present

## 2020-08-26 DIAGNOSIS — H2512 Age-related nuclear cataract, left eye: Secondary | ICD-10-CM | POA: Insufficient documentation

## 2020-08-26 HISTORY — PX: CATARACT EXTRACTION W/PHACO: SHX586

## 2020-08-26 HISTORY — DX: Presence of external hearing-aid: Z97.4

## 2020-08-26 SURGERY — PHACOEMULSIFICATION, CATARACT, WITH IOL INSERTION
Anesthesia: Monitor Anesthesia Care | Site: Eye | Laterality: Left

## 2020-08-26 MED ORDER — SIGHTPATH DOSE#1 BSS IO SOLN
INTRAOCULAR | Status: DC | PRN
  Administered 2020-08-26: 68 mL via OPHTHALMIC

## 2020-08-26 MED ORDER — ACETAMINOPHEN 325 MG PO TABS: 650.0000 mg | ORAL_TABLET | Freq: Once | ORAL | Status: DC | PRN

## 2020-08-26 MED ORDER — TETRACAINE HCL 0.5 % OP SOLN
1.0000 [drp] | OPHTHALMIC | Status: DC | PRN
  Administered 2020-08-26 (×3): 1 [drp] via OPHTHALMIC

## 2020-08-26 MED ORDER — SIGHTPATH DOSE#1 SODIUM HYALURONATE 10 MG/ML IO SOLUTION
PREFILLED_SYRINGE | INTRAOCULAR | Status: DC | PRN
  Administered 2020-08-26: 0.55 mL via INTRAOCULAR

## 2020-08-26 MED ORDER — PHENYLEPHRINE HCL 10 % OP SOLN
1.0000 [drp] | OPHTHALMIC | Status: AC
  Administered 2020-08-26 (×3): 1 [drp] via OPHTHALMIC

## 2020-08-26 MED ORDER — CYCLOPENTOLATE HCL 2 % OP SOLN
1.0000 [drp] | OPHTHALMIC | Status: AC
  Administered 2020-08-26 (×3): 1 [drp] via OPHTHALMIC

## 2020-08-26 MED ORDER — SIGHTPATH DOSE#1 SODIUM HYALURONATE 23 MG/ML IO SOLUTION
PREFILLED_SYRINGE | INTRAOCULAR | Status: DC | PRN
  Administered 2020-08-26: 0.55 mL via INTRAOCULAR

## 2020-08-26 MED ORDER — MIDAZOLAM HCL 2 MG/2ML IJ SOLN
INTRAMUSCULAR | Status: DC | PRN
  Administered 2020-08-26: .5 mg via INTRAVENOUS

## 2020-08-26 MED ORDER — MOXIFLOXACIN HCL 0.5 % OP SOLN
OPHTHALMIC | Status: DC | PRN
  Administered 2020-08-26: 0.2 mL via OPHTHALMIC

## 2020-08-26 MED ORDER — LIDOCAINE HCL (PF) 2 % IJ SOLN
INTRAOCULAR | Status: DC | PRN
  Administered 2020-08-26: 4 mL via INTRAOCULAR

## 2020-08-26 MED ORDER — ACETAMINOPHEN 160 MG/5ML PO SOLN: 325.0000 mg | ORAL | Status: DC | PRN

## 2020-08-26 MED ORDER — SIGHTPATH DOSE#1 BSS IO SOLN
INTRAOCULAR | Status: DC | PRN
  Administered 2020-08-26: 15 mL via INTRAOCULAR

## 2020-08-26 MED ORDER — ONDANSETRON HCL 4 MG/2ML IJ SOLN: 4.0000 mg | Freq: Once | INTRAMUSCULAR | Status: DC | PRN

## 2020-08-26 MED ORDER — FENTANYL CITRATE (PF) 100 MCG/2ML IJ SOLN
INTRAMUSCULAR | Status: DC | PRN
  Administered 2020-08-26: 50 ug via INTRAVENOUS

## 2020-08-26 MED ORDER — LACTATED RINGERS IV SOLN: INTRAVENOUS | Status: DC

## 2020-08-26 SURGICAL SUPPLY — 15 items
CANNULA ANT/CHMB 27GA (MISCELLANEOUS) ×4 IMPLANT
DISSECTOR HYDRO NUCLEUS 50X22 (MISCELLANEOUS) ×2 IMPLANT
GLOVE SURG ENC TEXT LTX SZ7.5 (GLOVE) ×2 IMPLANT
GLOVE SURG GAMMEX PI TX LF 7.5 (GLOVE) ×2 IMPLANT
GLOVE SURG SYN 8.5  E (GLOVE) ×1
GLOVE SURG SYN 8.5 E (GLOVE) ×1 IMPLANT
GOWN STRL REUS W/ TWL LRG LVL3 (GOWN DISPOSABLE) ×2 IMPLANT
GOWN STRL REUS W/TWL LRG LVL3 (GOWN DISPOSABLE) ×4
LENS IOL TECNIS EYHANCE 18.0 (Intraocular Lens) ×2 IMPLANT
MARKER SKIN DUAL TIP RULER LAB (MISCELLANEOUS) ×2 IMPLANT
PACK EYE AFTER SURG (MISCELLANEOUS) ×2 IMPLANT
SYR 3ML LL SCALE MARK (SYRINGE) ×2 IMPLANT
SYR TB 1ML LUER SLIP (SYRINGE) ×2 IMPLANT
WATER STERILE IRR 250ML POUR (IV SOLUTION) ×2 IMPLANT
WIPE NON LINTING 3.25X3.25 (MISCELLANEOUS) ×2 IMPLANT

## 2020-08-26 NOTE — Op Note (Signed)
OPERATIVE NOTE  Johnny Cole [id number] 08/26/2020   PREOPERATIVE DIAGNOSIS:  Nuclear sclerotic cataract left eye.  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     PROCEDURE:  Phacoemusification with posterior chamber intraocular lens placement of the left eye   LENS:   Implant Name Type Inv. Item Serial No. Manufacturer Lot No. LRB No. Used Action  LENS IOL TECNIS EYHANCE 18.0 - [id number] Intraocular Lens LENS IOL TECNIS EYHANCE 18.0 [id number] JOHNSON   Left 1 Implanted      Procedure(s): CATARACT EXTRACTION PHACO AND INTRAOCULAR LENS PLACEMENT (IOC) LEFT 1.99 00:20.0 (Left)  DIB00 +18.0   SURGEON:  Willey Blade, MD, MPH   ANESTHESIA:  Topical with tetracaine drops augmented with 1% preservative-free intracameral lidocaine.  ESTIMATED BLOOD LOSS: <1 mL   COMPLICATIONS:  None.   DESCRIPTION OF PROCEDURE:  The patient was identified in the holding room and transported to the operating room and placed in the supine position under the operating microscope.  The left eye was identified as the operative eye and it was prepped and draped in the usual sterile ophthalmic fashion.   A 1.0 millimeter clear-corneal paracentesis was made at the 5:00 position. 0.5 ml of preservative-free 1% lidocaine with epinephrine was injected into the anterior chamber.  The anterior chamber was filled with Healon 5 viscoelastic.  A 2.4 millimeter keratome was used to make a near-clear corneal incision at the 2:00 position.  A curvilinear capsulorrhexis was made with a cystotome and capsulorrhexis forceps.  Balanced salt solution was used to hydrodissect and hydrodelineate the nucleus.   Phacoemulsification was then used in stop and chop fashion to remove the lens nucleus and epinucleus.  The remaining cortex was then removed using the irrigation and aspiration handpiece. Healon was then placed into the capsular bag to distend it for lens placement.  A lens was then injected  into the capsular bag.  The remaining viscoelastic was aspirated.   Wounds were hydrated with balanced salt solution.  The anterior chamber was inflated to a physiologic pressure with balanced salt solution.  Intracameral vigamox 0.1 mL undiltued was injected into the eye and a drop placed onto the ocular surface.  No wound leaks were noted.  The patient was taken to the recovery room in stable condition without complications of anesthesia or surgery  Willey Blade 08/26/2020, 9:18 AM

## 2020-08-26 NOTE — Transfer of Care (Signed)
Immediate Anesthesia Transfer of Care Note  Patient: Johnny Cole  Procedure(s) Performed: CATARACT EXTRACTION PHACO AND INTRAOCULAR LENS PLACEMENT (IOC) LEFT 1.99 00:20.0 (Left: Eye)  Patient Location: PACU  Anesthesia Type: MAC  Level of Consciousness: awake, alert  and patient cooperative  Airway and Oxygen Therapy: Patient Spontanous Breathing and Patient connected to supplemental oxygen  Post-op Assessment: Post-op Vital signs reviewed, Patient's Cardiovascular Status Stable, Respiratory Function Stable, Patent Airway and No signs of Nausea or vomiting  Post-op Vital Signs: Reviewed and stable  Complications: No notable events documented.

## 2020-08-26 NOTE — Anesthesia Postprocedure Evaluation (Signed)
Anesthesia Post Note  Patient: Johnny Cole  Procedure(s) Performed: CATARACT EXTRACTION PHACO AND INTRAOCULAR LENS PLACEMENT (IOC) LEFT 1.99 00:20.0 (Left: Eye)     Patient location during evaluation: PACU Anesthesia Type: MAC Level of consciousness: awake and alert, oriented and patient cooperative Pain management: pain level controlled Vital Signs Assessment: post-procedure vital signs reviewed and stable Respiratory status: spontaneous breathing, nonlabored ventilation and respiratory function stable Cardiovascular status: blood pressure returned to baseline and stable Postop Assessment: adequate PO intake Anesthetic complications: no   No notable events documented.  Darrin Nipper

## 2020-08-26 NOTE — H&P (Signed)
[facility]   Primary Care Physician:  Jerrilyn Cairo Primary Care Ophthalmologist: Dr. Willey Blade  Pre-Procedure History & Physical: HPI:  Johnny Cole is a 75 y.o. male here for cataract surgery.   Past Medical History:  Diagnosis Date   Anxiety    BPH (benign prostatic hyperplasia)    Elevated PSA    Hyperlipidemia    Parkinson's disease (HCC)    Wears hearing aid in both ears     Past Surgical History:  Procedure Laterality Date   HERNIA REPAIR     umbilical   KNEE SURGERY      Prior to Admission medications   Medication Sig Start Date End Date Taking? Authorizing Provider  busPIRone (BUSPAR) 10 MG tablet Take 10 mg by mouth 2 (two) times daily.   Yes [provider]  carbidopa-levodopa (SINEMET IR) 25-250 MG tablet Take 1 tablet by mouth 3 (three) times daily.   Yes [provider]  citalopram (CELEXA) 20 MG tablet Take 40 mg by mouth daily.   Yes [provider]  donepezil (ARICEPT) 10 MG tablet Take 10 mg by mouth daily.   Yes [provider]  FIBER PO Take by mouth.   Yes [provider]  finasteride (PROSCAR) 5 MG tablet Take 5 mg by mouth daily.   Yes [provider]  Multiple Vitamins-Minerals (MULTIVITAMIN MEN PO) Take by mouth daily.   Yes [provider]  nystatin (MYCOSTATIN/NYSTOP) powder Apply 1 application topically daily.   Yes [provider]  simvastatin (ZOCOR) 20 MG tablet Take 20 mg by mouth daily at 6 PM.   Yes [provider]  tamsulosin (FLOMAX) 0.4 MG CAPS capsule Take 0.4 mg by mouth daily.   Yes [provider]  Carbidopa-Levodopa ER (SINEMET CR) 25-100 MG tablet controlled release Take 1 tablet by mouth 6 (six) times daily.    [provider]  mupirocin ointment (BACTROBAN) 2 % Apply 1 application topically 3 (three) times daily. Patient not taking: Reported on 08/15/2020 03/15/16   Lutricia Feil, PA-C  rOPINIRole (REQUIP) 0.5 MG tablet  Take 0.5 mg by mouth 3 (three) times daily. Patient not taking: Reported on 08/15/2020    [provider]    Allergies as of 07/04/2020   (No Known Allergies)    History reviewed. No pertinent family history.  Social History   Socioeconomic History   Marital status: Married    Spouse name: Not on file   Number of children: Not on file   Years of education: Not on file   Highest education level: Not on file  Occupational History   Not on file  Tobacco Use   Smoking status: Never   Smokeless tobacco: Never  Vaping Use   Vaping Use: Never used  Substance and Sexual Activity   Alcohol use: No   Drug use: No   Sexual activity: Not on file  Other Topics Concern   Not on file  Social History Narrative   Not on file   Social Determinants of Health   Financial Resource Strain: Not on file  Food Insecurity: Not on file  Transportation Needs: Not on file  Physical Activity: Not on file  Stress: Not on file  Social Connections: Not on file  Intimate Partner Violence: Not on file    Review of Systems: See HPI, otherwise negative ROS  Physical Exam: BP 124/71   Pulse 67   Temp (!) 97.2 F (36.2 C) (Temporal)   Ht 5\' 8"  (  1.727 m)   Wt 95 kg   SpO2 97%   BMI 31.84 kg/m  General:   Alert, cooperative in NAD Head:  Normocephalic and atraumatic. Respiratory:  Normal work of breathing. Cardiovascular:  RRR  Impression/Plan: Johnny Cole is here for cataract surgery.  Risks, benefits, limitations, and alternatives regarding cataract surgery have been reviewed with the patient.  Questions have been answered.  All parties agreeable.   Willey Blade, MD  08/26/2020, 8:50 AM

## 2020-08-26 NOTE — Anesthesia Procedure Notes (Signed)
Procedure Name: MAC Date/Time: 08/26/2020 8:58 AM Performed by: Dionne Bucy, CRNA Pre-anesthesia Checklist: Patient identified, Emergency Drugs available, Suction available, Patient being monitored and Timeout performed Patient Re-evaluated:Patient Re-evaluated prior to induction Oxygen Delivery Method: Nasal cannula Placement Confirmation: positive ETCO2

## 2020-08-27 ENCOUNTER — Encounter: Payer: Self-pay | Admitting: Ophthalmology

## 2020-09-04 ENCOUNTER — Encounter: Payer: Self-pay | Admitting: Ophthalmology

## 2020-09-05 NOTE — Discharge Instructions (Signed)

## 2020-09-09 ENCOUNTER — Other Ambulatory Visit: Payer: Self-pay

## 2020-09-09 ENCOUNTER — Encounter
Admission: RE | Disposition: A | Discharge status: Home / Self Care | Payer: Self-pay | Source: Home / Self Care | Attending: Ophthalmology

## 2020-09-09 ENCOUNTER — Ambulatory Visit: Payer: Medicare Other | Admitting: Anesthesiology

## 2020-09-09 ENCOUNTER — Ambulatory Visit
Admission: RE | Admit: 2020-09-09 | Discharge: 2020-09-09 | Disposition: A | Discharge status: Home / Self Care | Payer: Medicare Other | Attending: Ophthalmology | Admitting: Ophthalmology

## 2020-09-09 ENCOUNTER — Encounter: Payer: Self-pay | Admitting: Ophthalmology

## 2020-09-09 DIAGNOSIS — Z9842 Cataract extraction status, left eye: Secondary | ICD-10-CM | POA: Diagnosis not present

## 2020-09-09 DIAGNOSIS — H2511 Age-related nuclear cataract, right eye: Secondary | ICD-10-CM | POA: Diagnosis not present

## 2020-09-09 DIAGNOSIS — Z961 Presence of intraocular lens: Secondary | ICD-10-CM | POA: Diagnosis not present

## 2020-09-09 DIAGNOSIS — Z79899 Other long term (current) drug therapy: Secondary | ICD-10-CM | POA: Diagnosis not present

## 2020-09-09 HISTORY — PX: CATARACT EXTRACTION W/PHACO: SHX586

## 2020-09-09 SURGERY — PHACOEMULSIFICATION, CATARACT, WITH IOL INSERTION
Anesthesia: Monitor Anesthesia Care | Site: Eye | Laterality: Right

## 2020-09-09 MED ORDER — MIDAZOLAM HCL 2 MG/2ML IJ SOLN
INTRAMUSCULAR | Status: DC | PRN
  Administered 2020-09-09: .5 mg via INTRAVENOUS

## 2020-09-09 MED ORDER — SIGHTPATH DOSE#1 SODIUM HYALURONATE 10 MG/ML IO SOLUTION
PREFILLED_SYRINGE | INTRAOCULAR | Status: DC | PRN
  Administered 2020-09-09: 0.85 mL via INTRAOCULAR

## 2020-09-09 MED ORDER — ACETAMINOPHEN 160 MG/5ML PO SOLN: 325.0000 mg | ORAL | Status: DC | PRN

## 2020-09-09 MED ORDER — SIGHTPATH DOSE#1 SODIUM HYALURONATE 23 MG/ML IO SOLUTION
PREFILLED_SYRINGE | INTRAOCULAR | Status: DC | PRN
  Administered 2020-09-09: 0.55 mL via INTRAOCULAR

## 2020-09-09 MED ORDER — SIGHTPATH DOSE#1 BSS IO SOLN
INTRAOCULAR | Status: DC | PRN
  Administered 2020-09-09: 15 mL

## 2020-09-09 MED ORDER — PHENYLEPHRINE HCL 10 % OP SOLN
1.0000 [drp] | OPHTHALMIC | Status: DC | PRN
  Administered 2020-09-09 (×3): 1 [drp] via OPHTHALMIC

## 2020-09-09 MED ORDER — LIDOCAINE HCL (PF) 2 % IJ SOLN
INTRAOCULAR | Status: DC | PRN
  Administered 2020-09-09: 1 mL via INTRAOCULAR

## 2020-09-09 MED ORDER — FENTANYL CITRATE (PF) 100 MCG/2ML IJ SOLN
INTRAMUSCULAR | Status: DC | PRN
  Administered 2020-09-09: 50 ug via INTRAVENOUS

## 2020-09-09 MED ORDER — ACETAMINOPHEN 325 MG PO TABS: 325.0000 mg | ORAL_TABLET | ORAL | Status: DC | PRN

## 2020-09-09 MED ORDER — SIGHTPATH DOSE#1 BSS IO SOLN
INTRAOCULAR | Status: DC | PRN
  Administered 2020-09-09: 50 mL via OPHTHALMIC

## 2020-09-09 MED ORDER — TETRACAINE HCL 0.5 % OP SOLN
1.0000 [drp] | OPHTHALMIC | Status: DC | PRN
  Administered 2020-09-09 (×3): 1 [drp] via OPHTHALMIC

## 2020-09-09 MED ORDER — LACTATED RINGERS IV SOLN: INTRAVENOUS | Status: DC

## 2020-09-09 MED ORDER — CYCLOPENTOLATE HCL 2 % OP SOLN
1.0000 [drp] | OPHTHALMIC | Status: DC | PRN
  Administered 2020-09-09 (×3): 1 [drp] via OPHTHALMIC

## 2020-09-09 MED ORDER — MOXIFLOXACIN HCL 0.5 % OP SOLN
OPHTHALMIC | Status: DC | PRN
  Administered 2020-09-09: 0.2 mL via OPHTHALMIC

## 2020-09-09 SURGICAL SUPPLY — 15 items
CANNULA ANT/CHMB 27GA (MISCELLANEOUS) ×4 IMPLANT
DISSECTOR HYDRO NUCLEUS 50X22 (MISCELLANEOUS) ×2 IMPLANT
GLOVE SURG ENC TEXT LTX SZ7.5 (GLOVE) ×2 IMPLANT
GLOVE SURG GAMMEX PI TX LF 7.5 (GLOVE) ×2 IMPLANT
GLOVE SURG SYN 8.5  E (GLOVE) ×1
GLOVE SURG SYN 8.5 E (GLOVE) ×1 IMPLANT
GOWN STRL REUS W/ TWL LRG LVL3 (GOWN DISPOSABLE) ×2 IMPLANT
GOWN STRL REUS W/TWL LRG LVL3 (GOWN DISPOSABLE) ×4
LENS IOL TECNIS EYHANCE 16.5 (Intraocular Lens) ×2 IMPLANT
MARKER SKIN DUAL TIP RULER LAB (MISCELLANEOUS) ×2 IMPLANT
PACK EYE AFTER SURG (MISCELLANEOUS) ×2 IMPLANT
SYR 3ML LL SCALE MARK (SYRINGE) ×2 IMPLANT
SYR TB 1ML LUER SLIP (SYRINGE) ×2 IMPLANT
WATER STERILE IRR 250ML POUR (IV SOLUTION) ×2 IMPLANT
WIPE NON LINTING 3.25X3.25 (MISCELLANEOUS) ×2 IMPLANT

## 2020-09-09 NOTE — H&P (Signed)
[facility]   Primary Care Physician:  Jerrilyn Cairo Primary Care Ophthalmologist: Dr. Willey Blade  Pre-Procedure History & Physical: HPI:  Johnny Cole is a 75 y.o. male here for cataract surgery.   Past Medical History:  Diagnosis Date   Anxiety    BPH (benign prostatic hyperplasia)    Elevated PSA    Hyperlipidemia    Parkinson's disease (HCC)    Wears hearing aid in both ears     Past Surgical History:  Procedure Laterality Date   CATARACT EXTRACTION W/PHACO Left 08/26/2020   Procedure: CATARACT EXTRACTION PHACO AND INTRAOCULAR LENS PLACEMENT (IOC) LEFT 1.99 00:20.0;  Surgeon: Nevada Crane, MD;  Location: [facility];  Service: Ophthalmology;  Laterality: Left;   HERNIA REPAIR     umbilical   KNEE SURGERY      Prior to Admission medications   Medication Sig Start Date End Date Taking? Authorizing Provider  busPIRone (BUSPAR) 10 MG tablet Take 10 mg by mouth 2 (two) times daily.   Yes [provider]  carbidopa-levodopa (SINEMET IR) 25-250 MG tablet Take 1 tablet by mouth 3 (three) times daily.   Yes [provider]  Carbidopa-Levodopa ER (SINEMET CR) 25-100 MG tablet controlled release Take 1 tablet by mouth 6 (six) times daily.   Yes [provider]  citalopram (CELEXA) 20 MG tablet Take 40 mg by mouth daily.   Yes [provider]  donepezil (ARICEPT) 10 MG tablet Take 10 mg by mouth daily.   Yes [provider]  FIBER PO Take by mouth.   Yes [provider]  finasteride (PROSCAR) 5 MG tablet Take 5 mg by mouth daily.   Yes [provider]  Multiple Vitamins-Minerals (MULTIVITAMIN MEN PO) Take by mouth daily.   Yes [provider]  nystatin (MYCOSTATIN/NYSTOP) powder Apply 1 application topically daily.   Yes [provider]  simvastatin (ZOCOR) 20 MG tablet Take 20 mg by mouth daily at 6 PM.   Yes [provider]  tamsulosin (FLOMAX) 0.4 MG CAPS capsule Take 0.4  mg by mouth daily.   Yes [provider]  mupirocin ointment (BACTROBAN) 2 % Apply 1 application topically 3 (three) times daily. Patient not taking: Reported on 08/15/2020 03/15/16   Lutricia Feil, PA-C  rOPINIRole (REQUIP) 0.5 MG tablet Take 0.5 mg by mouth 3 (three) times daily. Patient not taking: Reported on 08/15/2020    [provider]    Allergies as of 07/04/2020   (No Known Allergies)    History reviewed. No pertinent family history.  Social History   Socioeconomic History   Marital status: Married    Spouse name: Not on file   Number of children: Not on file   Years of education: Not on file   Highest education level: Not on file  Occupational History   Not on file  Tobacco Use   Smoking status: Never   Smokeless tobacco: Never  Vaping Use   Vaping Use: Never used  Substance and Sexual Activity   Alcohol use: No   Drug use: No   Sexual activity: Not on file  Other Topics Concern   Not on file  Social History Narrative   Not on file   Social Determinants of Health   Financial Resource Strain: Not on file  Food Insecurity: Not on file  Transportation Needs: Not on file  Physical Activity: Not on file  Stress: Not on file  Social Connections: Not on file  Intimate Partner  Violence: Not on file    Review of Systems: See HPI, otherwise negative ROS  Physical Exam: BP 90/72   Pulse 64   Temp 97.9 F (36.6 C) (Temporal)   Resp 16   Ht 5\' 8"  (1.727 m)   Wt 97.1 kg   SpO2 97%   BMI 32.54 kg/m  General:   Alert, cooperative in NAD Head:  Normocephalic and atraumatic. Respiratory:  Normal work of breathing. Cardiovascular:  RRR  Impression/Plan: Johnny Cole is here for cataract surgery.  Risks, benefits, limitations, and alternatives regarding cataract surgery have been reviewed with the patient.  Questions have been answered.  All parties agreeable.   Kathrynn Speed, MD  09/09/2020, 9:16 AM

## 2020-09-09 NOTE — Anesthesia Postprocedure Evaluation (Signed)
Anesthesia Post Note  Patient: Johnny Cole  Procedure(s) Performed: CATARACT EXTRACTION PHACO AND INTRAOCULAR LENS PLACEMENT (IOC) RIGHT (Right: Eye)     Patient location during evaluation: PACU Anesthesia Type: MAC Level of consciousness: awake and alert Pain management: pain level controlled Vital Signs Assessment: post-procedure vital signs reviewed and stable Respiratory status: spontaneous breathing, nonlabored ventilation, respiratory function stable and patient connected to nasal cannula oxygen Cardiovascular status: stable and blood pressure returned to baseline Postop Assessment: no apparent nausea or vomiting Anesthetic complications: no   No notable events documented.  Wanda Plump Makaveli Hoard

## 2020-09-09 NOTE — Transfer of Care (Signed)
Immediate Anesthesia Transfer of Care Note  Patient: Johnny Cole  Procedure(s) Performed: CATARACT EXTRACTION PHACO AND INTRAOCULAR LENS PLACEMENT (IOC) RIGHT (Right: Eye)  Patient Location: PACU  Anesthesia Type: MAC  Level of Consciousness: awake, alert  and patient cooperative  Airway and Oxygen Therapy: Patient Spontanous Breathing and Patient connected to supplemental oxygen  Post-op Assessment: Post-op Vital signs reviewed, Patient's Cardiovascular Status Stable, Respiratory Function Stable, Patent Airway and No signs of Nausea or vomiting  Post-op Vital Signs: Reviewed and stable  Complications: No notable events documented.

## 2020-09-09 NOTE — Op Note (Signed)
OPERATIVE NOTE  Johnny Cole [id number] 09/09/2020   PREOPERATIVE DIAGNOSIS:  Nuclear sclerotic cataract right eye.  H25.11   POSTOPERATIVE DIAGNOSIS:    Nuclear sclerotic cataract right eye.     PROCEDURE:  Phacoemusification with posterior chamber intraocular lens placement of the right eye   LENS:   Implant Name Type Inv. Item Serial No. Manufacturer Lot No. LRB No. Used Action  LENS IOL TECNIS EYHANCE 16.5 - [id number] Intraocular Lens LENS IOL TECNIS EYHANCE 16.5 [id number] JOHNSON   Right 1 Implanted       Procedure(s) with comments: CATARACT EXTRACTION PHACO AND INTRAOCULAR LENS PLACEMENT (IOC) RIGHT (Right) - 2.54 00:21.8  DIB00 +16.5   ULTRASOUND TIME: 0 minutes 21 seconds.  CDE 2.54   SURGEON:  Willey Blade, MD, MPH  ANESTHESIOLOGIST: Anesthesiologist: Jarome Matin, MD CRNA: Michaele Offer, CRNA   ANESTHESIA:  Topical with tetracaine drops augmented with 1% preservative-free intracameral lidocaine.  ESTIMATED BLOOD LOSS: less than 1 mL.   COMPLICATIONS:  None.   DESCRIPTION OF PROCEDURE:  The patient was identified in the holding room and transported to the operating room and placed in the supine position under the operating microscope.  The right eye was identified as the operative eye and it was prepped and draped in the usual sterile ophthalmic fashion.   A 1.0 millimeter clear-corneal paracentesis was made at the 10:30 position. 0.5 ml of preservative-free 1% lidocaine with epinephrine was injected into the anterior chamber.  The anterior chamber was filled with Healon 5 viscoelastic.  A 2.4 millimeter keratome was used to make a near-clear corneal incision at the 8:00 position.  A curvilinear capsulorrhexis was made with a cystotome and capsulorrhexis forceps.  Balanced salt solution was used to hydrodissect and hydrodelineate the nucleus.   Phacoemulsification was then used in stop and chop fashion to remove the lens nucleus and epinucleus.  The remaining  cortex was then removed using the irrigation and aspiration handpiece. Healon was then placed into the capsular bag to distend it for lens placement.  A lens was then injected into the capsular bag.  The remaining viscoelastic was aspirated.   Wounds were hydrated with balanced salt solution.  The anterior chamber was inflated to a physiologic pressure with balanced salt solution.   Intracameral vigamox 0.1 mL undiluted was injected into the eye and a drop placed onto the ocular surface.  No wound leaks were noted.  The patient was taken to the recovery room in stable condition without complications of anesthesia or surgery  Willey Blade 09/09/2020, 9:33 AM

## 2020-09-09 NOTE — Anesthesia Preprocedure Evaluation (Signed)
Anesthesia Evaluation  Patient identified by MRN, date of birth, ID band Patient awake    Reviewed: Allergy & Precautions, NPO status , Patient's Chart, lab work & pertinent test results  History of Anesthesia Complications Negative for: history of anesthetic complications  Airway Mallampati: IV   Neck ROM: Full    Dental no notable dental hx.    Pulmonary neg pulmonary ROS,    Pulmonary exam normal breath sounds clear to auscultation       Cardiovascular Exercise Tolerance: Good negative cardio ROS Normal cardiovascular exam Rhythm:Regular Rate:Normal     Neuro/Psych PSYCHIATRIC DISORDERS Anxiety HOH  Neuromuscular disease (Parkinson disease)    GI/Hepatic negative GI ROS,   Endo/Other  negative endocrine ROS  Renal/GU negative Renal ROS     Musculoskeletal   Abdominal   Peds  Hematology negative hematology ROS (+)   Anesthesia Other Findings BPH  Reproductive/Obstetrics                            Anesthesia Physical Anesthesia Plan  ASA: 3  Anesthesia Plan: MAC   Post-op Pain Management:    Induction: Intravenous  PONV Risk Score and Plan: 1 and TIVA, Midazolam and Treatment may vary due to age or medical condition  Airway Management Planned: Nasal Cannula  Additional Equipment:   Intra-op Plan:   Post-operative Plan:   Informed Consent: I have reviewed the patients History and Physical, chart, labs and discussed the procedure including the risks, benefits and alternatives for the proposed anesthesia with the patient or authorized representative who has indicated his/her understanding and acceptance.       Plan Discussed with: CRNA  Anesthesia Plan Comments:       Anesthesia Quick Evaluation  

## 2020-09-10 ENCOUNTER — Encounter: Payer: Self-pay | Admitting: Ophthalmology

## 2021-11-15 ENCOUNTER — Emergency Department: Payer: Medicare Other

## 2021-11-15 DIAGNOSIS — Y92410 Unspecified street and highway as the place of occurrence of the external cause: Secondary | ICD-10-CM | POA: Diagnosis not present

## 2021-11-15 DIAGNOSIS — R8289 Other abnormal findings on cytological and histological examination of urine: Secondary | ICD-10-CM | POA: Insufficient documentation

## 2021-11-15 DIAGNOSIS — W1839XA Other fall on same level, initial encounter: Secondary | ICD-10-CM | POA: Insufficient documentation

## 2021-11-15 DIAGNOSIS — Z20822 Contact with and (suspected) exposure to covid-19: Secondary | ICD-10-CM | POA: Diagnosis not present

## 2021-11-15 DIAGNOSIS — R55 Syncope and collapse: Secondary | ICD-10-CM | POA: Diagnosis present

## 2021-11-15 DIAGNOSIS — D649 Anemia, unspecified: Secondary | ICD-10-CM | POA: Diagnosis not present

## 2021-11-15 DIAGNOSIS — G20A1 Parkinson's disease without dyskinesia, without mention of fluctuations: Secondary | ICD-10-CM | POA: Diagnosis not present

## 2021-11-15 DIAGNOSIS — I10 Essential (primary) hypertension: Secondary | ICD-10-CM | POA: Diagnosis not present

## 2021-11-15 DIAGNOSIS — R778 Other specified abnormalities of plasma proteins: Secondary | ICD-10-CM | POA: Diagnosis not present

## 2021-11-15 LAB — COMPREHENSIVE METABOLIC PANEL
ALT: 9 U/L (ref 0–44)
AST: 28 U/L (ref 15–41)
Albumin: 3.8 g/dL (ref 3.5–5.0)
Alkaline Phosphatase: 51 U/L (ref 38–126)
Anion gap: 6 (ref 5–15)
BUN: 24 mg/dL — ABNORMAL HIGH (ref 8–23)
CO2: 26 mmol/L (ref 22–32)
Calcium: 8.6 mg/dL — ABNORMAL LOW (ref 8.9–10.3)
Chloride: 106 mmol/L (ref 98–111)
Creatinine, Ser: 1.18 mg/dL (ref 0.61–1.24)
GFR, Estimated: >60 mL/min (ref >60)
Glucose, Bld: 124 mg/dL — ABNORMAL HIGH (ref 70–99)
Potassium: 3.8 mmol/L (ref 3.5–5.1)
Sodium: 138 mmol/L (ref 135–145)
Total Bilirubin: 1.2 mg/dL (ref 0.3–1.2)
Total Protein: 6.5 g/dL (ref 6.5–8.1)

## 2021-11-15 LAB — RESP PANEL BY RT-PCR (FLU A&B, COVID) ARPGX2
Influenza A by PCR: NEGATIVE
Influenza B by PCR: NEGATIVE
SARS Coronavirus 2 by RT PCR: NEGATIVE

## 2021-11-15 LAB — URINALYSIS, ROUTINE W REFLEX MICROSCOPIC
Bilirubin Urine: NEGATIVE
Glucose, UA: NEGATIVE mg/dL
Ketones, ur: 5 mg/dL — AB
Leukocytes,Ua: NEGATIVE
Nitrite: NEGATIVE
Protein, ur: 100 mg/dL — AB
Specific Gravity, Urine: 1.016 (ref 1.005–1.030)
pH: 5 (ref 5.0–8.0)

## 2021-11-15 LAB — CBC WITH DIFFERENTIAL/PLATELET
Abs Immature Granulocytes: 0.04 10*3/uL (ref 0.00–0.07)
Basophils Absolute: 0 10*3/uL (ref 0.0–0.1)
Basophils Relative: 0 %
Eosinophils Absolute: 0 10*3/uL (ref 0.0–0.5)
Eosinophils Relative: 0 %
HCT: 35.7 % — ABNORMAL LOW (ref 39.0–52.0)
Hemoglobin: 12.4 g/dL — ABNORMAL LOW (ref 13.0–17.0)
Immature Granulocytes: 1 %
Lymphocytes Relative: 9 %
Lymphs Abs: 0.7 10*3/uL (ref 0.7–4.0)
MCH: 33.6 pg (ref 26.0–34.0)
MCHC: 34.7 g/dL (ref 30.0–36.0)
MCV: 96.7 fL (ref 80.0–100.0)
Monocytes Absolute: 0.4 10*3/uL (ref 0.1–1.0)
Monocytes Relative: 5 %
Neutro Abs: 6.7 10*3/uL (ref 1.7–7.7)
Neutrophils Relative %: 85 %
Platelets: 141 10*3/uL — ABNORMAL LOW (ref 150–400)
RBC: 3.69 MIL/uL — ABNORMAL LOW (ref 4.22–5.81)
RDW: 12.4 % (ref 11.5–15.5)
WBC: 7.8 10*3/uL (ref 4.0–10.5)
nRBC: 0 % (ref 0.0–0.2)

## 2021-11-15 LAB — TROPONIN I (HIGH SENSITIVITY): Troponin I (High Sensitivity): 18 ng/L — ABNORMAL HIGH (ref <18)

## 2021-11-15 NOTE — ED Triage Notes (Signed)
First RN Note: Pt to ED via [facility] from home with hx of parkinsons and paroxysmal a-fib. Per EMS pt stood up, got dizzy and had a syncopal episode with +LOC. Per EMS +orthostatic 129/70 sitting to 105/61 standing, HR 76 to 120. Per EMS pt A&O x4, GCS 15.   18L AC  1L NS given en route

## 2021-11-15 NOTE — ED Triage Notes (Signed)
See first nurse note- Pt wife in triage also states pt has had multiple falls recently, loss of appetite, and has not had Bowel movement in over 1 week

## 2021-11-16 ENCOUNTER — Other Ambulatory Visit: Payer: Self-pay

## 2021-11-16 ENCOUNTER — Encounter: Payer: Self-pay | Admitting: Emergency Medicine

## 2021-11-16 ENCOUNTER — Emergency Department
Admission: EM | Admit: 2021-11-16 | Discharge: 2021-11-16 | Disposition: A | Discharge status: Home / Self Care | Payer: Medicare Other | Attending: Emergency Medicine | Admitting: Emergency Medicine

## 2021-11-16 DIAGNOSIS — W19XXXA Unspecified fall, initial encounter: Secondary | ICD-10-CM

## 2021-11-16 DIAGNOSIS — R55 Syncope and collapse: Secondary | ICD-10-CM

## 2021-11-16 LAB — TROPONIN I (HIGH SENSITIVITY): Troponin I (High Sensitivity): 25 ng/L — ABNORMAL HIGH (ref <18)

## 2021-11-16 NOTE — ED Notes (Signed)
Dr. Jacelyn Grip, EDP at bedside for reevaluation.

## 2021-11-16 NOTE — ED Provider Notes (Signed)
[facility] Provider Note    Event Date/Time   First MD Initiated Contact with Patient 11/16/21 [phone]     (approximate)   History   Loss of Consciousness   HPI  Johnny Cole is a 76 y.o. male   Past medical history of Parkinson's disease, BPH, hyperlipidemia, anxiety, typically ambulatory with walker at home.  Lives at home with his wife.  He has frequent falls.  He was ambulating today with his walker and had a fall where he fell onto his knees, soft landing, no significant trauma or head strike or loss of consciousness.  He was able to try to get up by himself but struggled to do so and after he stood up felt lightheaded and had a presyncopal event.  At this time, he denies chest pain, palpitations, difficulty breathing, or any other associated symptoms.  After resting for a while he felt well.  Has no complaints currently.  He frequently feels lightheaded upon standing too quickly.  He has been eating and drinking appropriately and voiding appropriately, no recent illnesses, no vomiting or diarrhea.  History was obtained via the patient and his wife who is at bedside.      Physical Exam   Triage Vital Signs: ED Triage Vitals [11/15/21 1929]  Enc Vitals Group     BP 135/83     Pulse Rate 89     Resp 19     Temp 98.2 F (36.8 C)     Temp Source Oral     SpO2 99 %     Weight 213 lb (96.6 kg)     Height 5\' 8"  (1.727 m)     Head Circumference      Peak Flow      Pain Score 0     Pain Loc      Pain Edu?      Excl. in [place]?     Most recent vital signs: Vitals:   11/16/21 0700 11/16/21 0754  BP: (!) 166/96 (!) 170/90  Pulse: 79 82  Resp: 18 18  Temp:    SpO2: 98% 99%    General: Awake, no distress.  Anisocoria which is normal for him CV:  Good peripheral perfusion.  Resp:  Normal effort.  Lungs clear Abd:  No distention. nontender Other:  Nontoxic-appearing, comfortable and pleasant.  Skin appears warm and well-perfused euvolemic.   Hypertensive.  No signs of trauma to the head neck, thorax back abdomen or extremities.   ED Results / Procedures / Treatments   Labs (all labs ordered are listed, but only abnormal results are displayed) Labs Reviewed  CBC WITH DIFFERENTIAL/PLATELET - Abnormal; Notable for the following components:      Result Value   RBC 3.69 (*)    Hemoglobin 12.4 (*)    HCT 35.7 (*)    Platelets 141 (*)    All other components within normal limits  COMPREHENSIVE METABOLIC PANEL - Abnormal; Notable for the following components:   Glucose, Bld 124 (*)    BUN 24 (*)    Calcium 8.6 (*)    All other components within normal limits  URINALYSIS, ROUTINE W REFLEX MICROSCOPIC - Abnormal; Notable for the following components:   Color, Urine YELLOW (*)    APPearance CLOUDY (*)    Hgb urine dipstick SMALL (*)    Ketones, ur 5 (*)    Protein, ur 100 (*)    Bacteria, UA RARE (*)    All other components within normal  limits  TROPONIN I (HIGH SENSITIVITY) - Abnormal; Notable for the following components:   Troponin I (High Sensitivity) 18 (*)    All other components within normal limits  TROPONIN I (HIGH SENSITIVITY) - Abnormal; Notable for the following components:   Troponin I (High Sensitivity) 25 (*)    All other components within normal limits  RESP PANEL BY RT-PCR (FLU A&B, COVID) [id number]     I reviewed labs and they are notable for hemoglobin of 12.4, rare bacteria in the urine without urinary symptoms.  Troponin was 18 and 25.  EKG  ED ECG REPORT I, Lucillie Garfinkel, the attending physician, personally viewed and interpreted this ECG.   Date: 11/16/2021  EKG Time: 1934  Rate: 92  Rhythm: normal EKG, normal sinus rhythm  Axis: nl  Intervals:none  ST&T Change: no stemi    RADIOLOGY I independently reviewed and interpreted CT scan of the head without IV contrast and see no obvious bleeding or midline shift   PROCEDURES:  Critical Care performed: No  Procedures   MEDICATIONS ORDERED  IN ED: Medications - No data to display   IMPRESSION / MDM / [place] / ED COURSE  I reviewed the triage vital signs and the nursing notes.                              Differential diagnosis includes, but is not limited to, dehydration, electrolyte derangement, cardiac syncope like arrhythmia or ACS, PE, infection, vasovagal or orthostatic syncope most likely   The patient is on the cardiac monitor to evaluate for evidence of arrhythmia and/or significant heart rate changes.  MDM: Patient with symptoms most likely due to orthostatic syncope, denies chest pain, denies infectious focal symptoms.  Feels well now.  Given his fall and age, did a screening exam of CT head to rule out head bleed.  Orthostatics were abnormal but improved however still dropped blood pressure after 1 L of crystalloid.  His troponins were elevated 18-25, EKG nonischemic, and patient denies chest pain or chest discomfort or shortness of breath.  He feels comfortable.  His urinalysis had rare bacteria and without urinary symptoms, defer treatment at this time.  Overall, benign work-up however given orthostatic symptoms and orthostatic vital signs still abnormal after 1 L of crystalloid with some small increase in troponins I offered admission for this patient but he declined understanding all risks and knowing his predisposition for orthostatic hypotension and risk for syncope and falls in the future.  He will follow-up with his PMD and return to the emergency department with any worsening.  Patient's presentation is most consistent with acute presentation with potential threat to life or bodily function.       FINAL CLINICAL IMPRESSION(S) / ED DIAGNOSES   Final diagnoses:  Near syncope  Fall, initial encounter     Rx / DC Orders   ED Discharge Orders     None        Note:  This document was prepared using Dragon voice recognition software and may include unintentional dictation  errors.    Lucillie Garfinkel, MD 11/16/21 [phone]

## 2021-11-16 NOTE — Discharge Instructions (Addendum)
Drink plenty of fluids to stay hydrated and take plenty of time to assimilate before standing from sitting.   Thank you for choosing [place] for your health care today!  Please see your primary doctor this week for a follow up appointment.   If you do not have a primary doctor call the following clinics to establish care:  If you have insurance:  [facility] [phone] [place] [address]   [facility]  [phone] [address] [place] [address]   If you do not have insurance:  [facility]  [phone] [address], [place] [address]  Sometimes, in the early stages of certain disease courses it is difficult to detect in the emergency department evaluation -- so, it is important that you continue to monitor your symptoms and call your doctor right away or return to the emergency department if you develop any new or worsening symptoms.  It was my pleasure to care for you today.   [place] Jacelyn Grip, MD

## 2021-11-23 ENCOUNTER — Other Ambulatory Visit: Payer: Self-pay

## 2021-11-23 ENCOUNTER — Observation Stay
Admission: EM | Admit: 2021-11-23 | Discharge: 2021-11-24 | Disposition: A | Discharge status: Home / Self Care | Payer: Medicare Other | Attending: Internal Medicine | Admitting: Internal Medicine

## 2021-11-23 ENCOUNTER — Emergency Department: Payer: Medicare Other

## 2021-11-23 DIAGNOSIS — F419 Anxiety disorder, unspecified: Secondary | ICD-10-CM

## 2021-11-23 DIAGNOSIS — Z7901 Long term (current) use of anticoagulants: Secondary | ICD-10-CM | POA: Insufficient documentation

## 2021-11-23 DIAGNOSIS — K219 Gastro-esophageal reflux disease without esophagitis: Secondary | ICD-10-CM

## 2021-11-23 DIAGNOSIS — G20B1 Parkinson's disease with dyskinesia, without mention of fluctuations: Secondary | ICD-10-CM

## 2021-11-23 DIAGNOSIS — I4891 Unspecified atrial fibrillation: Secondary | ICD-10-CM | POA: Insufficient documentation

## 2021-11-23 DIAGNOSIS — E785 Hyperlipidemia, unspecified: Secondary | ICD-10-CM | POA: Diagnosis not present

## 2021-11-23 DIAGNOSIS — E119 Type 2 diabetes mellitus without complications: Secondary | ICD-10-CM | POA: Insufficient documentation

## 2021-11-23 DIAGNOSIS — N400 Enlarged prostate without lower urinary tract symptoms: Secondary | ICD-10-CM

## 2021-11-23 DIAGNOSIS — R55 Syncope and collapse: Secondary | ICD-10-CM | POA: Diagnosis not present

## 2021-11-23 DIAGNOSIS — Z79899 Other long term (current) drug therapy: Secondary | ICD-10-CM | POA: Diagnosis not present

## 2021-11-23 DIAGNOSIS — G20A1 Parkinson's disease without dyskinesia, without mention of fluctuations: Secondary | ICD-10-CM

## 2021-11-23 DIAGNOSIS — G20C Parkinsonism, unspecified: Secondary | ICD-10-CM | POA: Diagnosis not present

## 2021-11-23 DIAGNOSIS — F32A Depression, unspecified: Secondary | ICD-10-CM

## 2021-11-23 HISTORY — DX: Syncope and collapse: R55

## 2021-11-23 LAB — CBC
HCT: 37.5 % — ABNORMAL LOW (ref 39.0–52.0)
Hemoglobin: 12.9 g/dL — ABNORMAL LOW (ref 13.0–17.0)
MCH: 32.6 pg (ref 26.0–34.0)
MCHC: 34.4 g/dL (ref 30.0–36.0)
MCV: 94.7 fL (ref 80.0–100.0)
Platelets: 159 10*3/uL (ref 150–400)
RBC: 3.96 MIL/uL — ABNORMAL LOW (ref 4.22–5.81)
RDW: 12.7 % (ref 11.5–15.5)
WBC: 8.9 10*3/uL (ref 4.0–10.5)
nRBC: 0 % (ref 0.0–0.2)

## 2021-11-23 LAB — BASIC METABOLIC PANEL
Anion gap: 7 (ref 5–15)
BUN: 22 mg/dL (ref 8–23)
CO2: 28 mmol/L (ref 22–32)
Calcium: 8.8 mg/dL — ABNORMAL LOW (ref 8.9–10.3)
Chloride: 104 mmol/L (ref 98–111)
Creatinine, Ser: 1.08 mg/dL (ref 0.61–1.24)
GFR, Estimated: >60 mL/min (ref >60)
Glucose, Bld: 120 mg/dL — ABNORMAL HIGH (ref 70–99)
Potassium: 4.1 mmol/L (ref 3.5–5.1)
Sodium: 139 mmol/L (ref 135–145)

## 2021-11-23 LAB — TROPONIN I (HIGH SENSITIVITY)
Troponin I (High Sensitivity): 11 ng/L (ref <18)
Troponin I (High Sensitivity): 11 ng/L (ref <18)

## 2021-11-23 MED ORDER — ACETAMINOPHEN 650 MG RE SUPP: 650.0000 mg | Freq: Four times a day (QID) | RECTAL | Status: DC | PRN

## 2021-11-23 MED ORDER — MAGNESIUM HYDROXIDE 400 MG/5ML PO SUSP: 30.0000 mL | Freq: Every day | ORAL | Status: DC | PRN

## 2021-11-23 MED ORDER — SODIUM CHLORIDE 0.9% FLUSH: 3.0000 mL | Freq: Two times a day (BID) | INTRAVENOUS | Status: DC

## 2021-11-23 MED ORDER — SODIUM CHLORIDE 0.9 % IV SOLN: INTRAVENOUS | Status: DC

## 2021-11-23 MED ORDER — CITALOPRAM HYDROBROMIDE 20 MG PO TABS
40.0000 mg | ORAL_TABLET | Freq: Every day | ORAL | Status: DC
  Administered 2021-11-24: 40 mg via ORAL
  Filled 2021-11-23: qty 2

## 2021-11-23 MED ORDER — SIMVASTATIN 20 MG PO TABS: 20.0000 mg | ORAL_TABLET | Freq: Every day | ORAL | Status: DC

## 2021-11-23 MED ORDER — TAMSULOSIN HCL 0.4 MG PO CAPS
0.4000 mg | ORAL_CAPSULE | Freq: Every day | ORAL | Status: DC
  Administered 2021-11-24: 0.4 mg via ORAL
  Filled 2021-11-23: qty 1

## 2021-11-23 MED ORDER — ACETAMINOPHEN 325 MG PO TABS: 650.0000 mg | ORAL_TABLET | Freq: Four times a day (QID) | ORAL | Status: DC | PRN

## 2021-11-23 MED ORDER — FINASTERIDE 5 MG PO TABS
5.0000 mg | ORAL_TABLET | Freq: Every day | ORAL | Status: DC
  Administered 2021-11-24: 5 mg via ORAL
  Filled 2021-11-23 (×2): qty 1

## 2021-11-23 MED ORDER — CARBIDOPA-LEVODOPA 25-250 MG PO TABS
1.0000 | ORAL_TABLET | Freq: Three times a day (TID) | ORAL | Status: DC
  Administered 2021-11-23 – 2021-11-24 (×2): 1 via ORAL
  Filled 2021-11-23 (×4): qty 1

## 2021-11-23 MED ORDER — ONDANSETRON HCL 4 MG/2ML IJ SOLN: 4.0000 mg | Freq: Four times a day (QID) | INTRAMUSCULAR | Status: DC | PRN

## 2021-11-23 MED ORDER — ENOXAPARIN SODIUM 60 MG/0.6ML IJ SOSY
0.5000 mg/kg | PREFILLED_SYRINGE | INTRAMUSCULAR | Status: DC
  Administered 2021-11-23: 47.5 mg via SUBCUTANEOUS
  Filled 2021-11-23: qty 0.6

## 2021-11-23 MED ORDER — TRAZODONE HCL 50 MG PO TABS: 25.0000 mg | ORAL_TABLET | Freq: Every evening | ORAL | Status: DC | PRN

## 2021-11-23 MED ORDER — DONEPEZIL HCL 5 MG PO TABS
10.0000 mg | ORAL_TABLET | Freq: Every day | ORAL | Status: DC
  Administered 2021-11-24: 10 mg via ORAL
  Filled 2021-11-23: qty 2

## 2021-11-23 MED ORDER — ENOXAPARIN SODIUM 40 MG/0.4ML IJ SOSY: 40.0000 mg | PREFILLED_SYRINGE | INTRAMUSCULAR | Status: DC

## 2021-11-23 MED ORDER — ADULT MULTIVITAMIN W/MINERALS CH
1.0000 | ORAL_TABLET | Freq: Every day | ORAL | Status: DC
  Administered 2021-11-23 – 2021-11-24 (×2): 1 via ORAL
  Filled 2021-11-23 (×2): qty 1

## 2021-11-23 MED ORDER — BUSPIRONE HCL 15 MG PO TABS
30.0000 mg | ORAL_TABLET | Freq: Two times a day (BID) | ORAL | Status: DC
  Administered 2021-11-23: 30 mg via ORAL
  Filled 2021-11-23 (×3): qty 2

## 2021-11-23 MED ORDER — CARBIDOPA-LEVODOPA ER 25-100 MG PO TBCR
1.0000 | EXTENDED_RELEASE_TABLET | Freq: Once | ORAL | Status: AC
  Administered 2021-11-23: 1 via ORAL
  Filled 2021-11-23: qty 1

## 2021-11-23 MED ORDER — ONDANSETRON HCL 4 MG PO TABS: 4.0000 mg | ORAL_TABLET | Freq: Four times a day (QID) | ORAL | Status: DC | PRN

## 2021-11-23 NOTE — Progress Notes (Signed)
PHARMACIST - PHYSICIAN COMMUNICATION  CONCERNING:  Enoxaparin (Lovenox) for DVT Prophylaxis    RECOMMENDATION: Patient was prescribed enoxaprin 40mg  q24 hours for VTE prophylaxis.   Filed Weights   11/23/21 1630  Weight: 97 kg (213 lb 13.5 oz)    Body mass index is 32.52 kg/m.  Estimated Creatinine Clearance: 64.7 mL/min (by C-G formula based on SCr of 1.08 mg/dL).   Based on [place] patient is candidate for enoxaparin 0.5mg /kg TBW SQ every 24 hours based on BMI being >30.    DESCRIPTION: Pharmacy has adjusted enoxaparin dose per [facility] policy.  Patient is now receiving enoxaparin 47.5 mg every 24 hours    Alverta Caccamo Rodriguez-Guzman PharmD, BCPS 11/23/2021 8:19 PM

## 2021-11-23 NOTE — ED Triage Notes (Signed)
Pt presents via EMS c/o fall at home. Reports got into chair after fall and had syncopal episode. Reports fall yesterday as well. No obvious injuries per EMS. Reports seen x1 week ago with similar episode. 20 IV in left hand.

## 2021-11-23 NOTE — Assessment & Plan Note (Addendum)
-   We will continue Proscar and Flomax. ?

## 2021-11-23 NOTE — Assessment & Plan Note (Signed)
-   We will continue statin therapy. 

## 2021-11-23 NOTE — ED Notes (Addendum)
RN to bedside to introduce self to pt. Pt is CAOx4 and in no acute distress. Wife at bedside. Pt advised he did not have his AFIB meds this morning bc he felt sick.

## 2021-11-23 NOTE — Assessment & Plan Note (Signed)
-   The patient will be placed on supplemental coverage with NovoLog. - We will hold off metformin for now.

## 2021-11-23 NOTE — ED Notes (Signed)
Report given to Jenny Reichmann, RN and pt placed in rm 11

## 2021-11-23 NOTE — ED Triage Notes (Signed)
Pt to ED via [facility] from home for syncopal episode today. Pt lost consciousness for about 4-5 minutes. Pt does not remember passing out today. Did fall to floor per wife, no head trauma. Also passed out last night briefly. Takes Eliquis.Seen here for same 1 week ago, was told has orthostatic hypotension.    554mL NS given by EMS, 20# to L hand. Hx PD with frequent falls per wife.   Denies SOB, CP. Alert, oriented. PA at bedside.

## 2021-11-23 NOTE — ED Provider Notes (Signed)
[facility] Provider Note    Event Date/Time   First MD Initiated Contact with Patient 11/23/21 1836     (approximate)   History   Loss of Consciousness   HPI  Johnny Cole is a 76 y.o. male history of atrial fibrillation, Parkinson's disease on anticoagulation  Patient reports along with his wife that he is passed out about 3 times since his previous ED visit where he passed out.  He thinks he is suffering from episodes of low blood pressure.  He has not suffered any injuries not a head strikes.  No headaches.  All of a sudden out of the blue he will just feel weak lightheaded nauseated and will pass out.  Today he was in the laundry room he stumbled due to his Parkinson's his wife and daughter got him up to the chair and then he passed on the chair for about 4 to 5 minutes  No chest pain no palpitations.  No trouble breathing.  Did not strike his head no neck pain.  He has had a few episodes of passing out in the last couple months but in particular this week he has passed out 3 or 4 times  He and his wife are concerned because of he keeps passing out they fear that he could suffer a head injury and he is on a blood thinner.     Physical Exam   Triage Vital Signs: ED Triage Vitals  Enc Vitals Group     BP 11/23/21 1629 (!) 146/79     Pulse Rate 11/23/21 1629 67     Resp 11/23/21 1629 15     Temp 11/23/21 1629 97.9 F (36.6 C)     Temp Source 11/23/21 1629 Oral     SpO2 11/23/21 1629 99 %     Weight 11/23/21 1630 213 lb 13.5 oz (97 kg)     Height 11/23/21 1630 5\' 8"  (1.727 m)     Head Circumference --      Peak Flow --      Pain Score 11/23/21 1630 0     Pain Loc --      Pain Edu? --      Excl. in [place]? --     Most recent vital signs: Vitals:   11/23/21 1629 11/23/21 1830  BP: (!) 146/79 (!) 161/79  Pulse: 67 81  Resp: 15 20  Temp: 97.9 F (36.6 C)   SpO2: 99% 99%     General: Awake, no distress.  He is very pleasant his wife very  pleasant CV:  Good peripheral perfusion.  Irregular but normal rate.  No bradycardia and no episodes of tachycardia to noted Resp:  Normal effort.  Clear bilateral Abd:  No distention.  Soft nontender nondistended Other:  Parkinson's-like facial appearance.  Slowness of motor movements, but no deficits.  Alert well oriented very pleasant   ED Results / Procedures / Treatments   Labs (all labs ordered are listed, but only abnormal results are displayed) Labs Reviewed  BASIC METABOLIC PANEL - Abnormal; Notable for the following components:      Result Value   Glucose, Bld 120 (*)    Calcium 8.8 (*)    All other components within normal limits  CBC - Abnormal; Notable for the following components:   RBC 3.96 (*)    Hemoglobin 12.9 (*)    HCT 37.5 (*)    All other components within normal limits  TROPONIN I (HIGH SENSITIVITY)  TROPONIN  I (HIGH SENSITIVITY)     EKG  And interpreted by me at 1630 heart rate 75 QRS 80 QTc 430 Normal sinus rhythm, no evidence of acute ischemia   RADIOLOGY  Chest x-ray interpreted by me as normal  Discussed with patient and his wife obtaining CT scan of the head as he has had multiple episodes of syncope and is on a blood thinner but he denies headache and he and his wife are both confident that he has not suffered any head injuries during these episodes.  Today's episode he did not fall striking his head, and he passed out sitting up in the chair.  He is at his normal mental baseline at this time   PROCEDURES:  Critical Care performed: No  Procedures   MEDICATIONS ORDERED IN ED: Medications  Carbidopa-Levodopa ER (SINEMET CR) 25-100 MG tablet controlled release 1 tablet (has no administration in time range)     IMPRESSION / MDM / ASSESSMENT AND PLAN / ED COURSE  I reviewed the triage vital signs and the nursing notes.                              Differential diagnosis includes, but is not limited to, possible orthostatic symptoms,  vasovagal syncope, neurogenic syncope, possible arrhythmia cardiac syncope, etc.  His cardiac work-up to this point and telemetry monitoring thus far reassuring.  He does have atrial fibrillation.  His metabolic panel and CBC are without acute abnormality.  He denies any acute cardiopulmonary symptoms.  He has no shortness of breath no hypoxia no tachycardia no clinical signs or symptoms of would suggest thromboembolic disease  Patient's presentation is most consistent with acute complicated illness / injury requiring diagnostic workup.  The patient is on the cardiac monitor to evaluate for evidence of arrhythmia and/or significant heart rate changes.  Informed patient and wife that if he needs to get up from bed to please call for assistance as we would not want for him to get up and have an episode of passing out in the hospital.  Patient in agreement   ----------------------------------------- 7:10 PM on 11/23/2021 -----------------------------------------  As the patient has had multiple episodes of syncope in the last week, I believe that he needs telemetry monitoring and further work-up and may benefit from a neurology consult as well given what I suspect is his symptoms may be related to his Parkinson's.  Discussed with patient and wife they are both understanding agreeable with admission, telemetry monitoring, and further evaluation and consultation with our internal medicine/hospitalist service  Clinical history and case discussed by the hospitalist, accepted to the hospital service by Dr. Sidney Ace     FINAL CLINICAL IMPRESSION(S) / ED DIAGNOSES   Final diagnoses:  Syncope and collapse     Rx / DC Orders   ED Discharge Orders     None        Note:  This document was prepared using Dragon voice recognition software and may include unintentional dictation errors.   Delman Kitten, MD 11/23/21 [phone]

## 2021-11-23 NOTE — Assessment & Plan Note (Signed)
-   We will continue PPI therapy 

## 2021-11-23 NOTE — ED Notes (Signed)
Blue top sent with other labs. 

## 2021-11-23 NOTE — Assessment & Plan Note (Addendum)
-   We will continue his Sinemet IR and CR.

## 2021-11-23 NOTE — H&P (Signed)
[place]   PATIENT NAME: Johnny Cole    MR#:  [id number]  DATE OF BIRTH:  1946/01/11  DATE OF ADMISSION:  11/23/2021  PRIMARY CARE PHYSICIAN: [place]   Patient is coming from: Home  REQUESTING/REFERRING PHYSICIAN: Delman Kitten, MD  CHIEF COMPLAINT:   Chief Complaint  Patient presents with   Loss of Consciousness    HISTORY OF PRESENT ILLNESS:  Johnny Cole is a 76 y.o. Caucasian male with medical history significant for anxiety, BPH, dyslipidemia and Parkinson's disease as well as history of syncope, who presented to the emergency room with acute onset of recurrent syncope.The patient had 3 episodes over the last week.  He was seen here in the ER for the first episode on 10/28 with syncope when he had a negative head CT scan and was hydrated and discharged from the ED.Today he had a fall and when he was picked up and was seated in his walker he passed out.  He denies any chest pain or palpitations.  He denied any headache or paresthesias or focal muscle weakness.  No fever or chills.  No dysuria, oliguria or hematuria, urgency or frequency or flank pain.  No cough or wheezing or hemoptysis.  ED Course: Upon presentation to the ER, BP was 146/79 with otherwise normal vital signs.  Later on BP was 175/83.  Labs revealed unremarkable BMP.  CBC showed mild anemia.  High sensitive troponin I was 11. EKG as reviewed by me : Twelve-lead EKG showed normal normal sinus rhythm with a rate of 75 with poor R wave progression and Q waves anteroseptally. Imaging: 2 view chest x-ray showed no acute cardiopulmonary disease.N oncontrasted head CT scan on 10/28 was essentially negative.   He was given 1 p.o. Sinemet.  He will be admitted to an observation medical telemetry bed for further evaluation and management.  PAST MEDICAL HISTORY:   Past Medical History:  Diagnosis Date   Anxiety    BPH (benign prostatic hyperplasia)    Elevated PSA    Hyperlipidemia     Parkinson's disease    Syncope and collapse    Wears hearing aid in both ears     PAST SURGICAL HISTORY:   Past Surgical History:  Procedure Laterality Date   CATARACT EXTRACTION W/PHACO Left 08/26/2020   Procedure: CATARACT EXTRACTION PHACO AND INTRAOCULAR LENS PLACEMENT ([place]) LEFT 1.99 00:20.0;  Surgeon: Eulogio Bear, MD;  Location: [place];  Service: Ophthalmology;  Laterality: Left;   CATARACT EXTRACTION W/PHACO Right 09/09/2020   Procedure: CATARACT EXTRACTION PHACO AND INTRAOCULAR LENS PLACEMENT ([facility]) RIGHT;  Surgeon: Eulogio Bear, MD;  Location: [place];  Service: Ophthalmology;  Laterality: Right;  2.54 00:21.8   HERNIA REPAIR     umbilical   KNEE SURGERY      SOCIAL HISTORY:   Social History   Tobacco Use   Smoking status: Never   Smokeless tobacco: Never  Substance Use Topics   Alcohol use: No    FAMILY HISTORY:  Positive for MI, CVA, hypertension and cancer.  DRUG ALLERGIES:  No Known Allergies  REVIEW OF SYSTEMS:   ROS As per history of present illness. All pertinent systems were reviewed above. Constitutional, HEENT, cardiovascular, respiratory, GI, GU, musculoskeletal, neuro, psychiatric, endocrine, integumentary and hematologic systems were reviewed and are otherwise negative/unremarkable except for positive findings mentioned above in the HPI.   MEDICATIONS AT HOME:   Prior to Admission medications   Medication Sig Start Date  End Date Taking? Authorizing Provider  busPIRone (BUSPAR) 10 MG tablet Take 10 mg by mouth 2 (two) times daily.    [provider]  carbidopa-levodopa (SINEMET IR) 25-250 MG tablet Take 1 tablet by mouth 3 (three) times daily.    [provider]  Carbidopa-Levodopa ER (SINEMET CR) 25-100 MG tablet controlled release Take 1 tablet by mouth 6 (six) times daily.    [provider]  citalopram (CELEXA) 20 MG tablet Take 40 mg by mouth daily.    [provider]   donepezil (ARICEPT) 10 MG tablet Take 10 mg by mouth daily.    [provider]  FIBER PO Take by mouth.    [provider]  finasteride (PROSCAR) 5 MG tablet Take 5 mg by mouth daily.    [provider]  Multiple Vitamins-Minerals (MULTIVITAMIN MEN PO) Take by mouth daily.    [provider]  mupirocin ointment (BACTROBAN) 2 % Apply 1 application topically 3 (three) times daily. Patient not taking: Reported on 08/15/2020 03/15/16   Ovid Curd P, PA-C  nystatin (MYCOSTATIN/NYSTOP) powder Apply 1 application topically daily.    [provider]  simvastatin (ZOCOR) 20 MG tablet Take 20 mg by mouth daily at 6 PM.    [provider]  tamsulosin (FLOMAX) 0.4 MG CAPS capsule Take 0.4 mg by mouth daily.    [provider]      VITAL SIGNS:  Blood pressure (!) 161/79, pulse 81, temperature 98 F (36.7 C), resp. rate 20, height 5\' 8"  (1.727 m), weight 97 kg, SpO2 99 %.  PHYSICAL EXAMINATION:  Physical Exam  GENERAL:  76 y.o.-year-old Caucasian male patient lying in the bed with no acute distress.  EYES: Pupils equal, round, reactive to light and accommodation. No scleral icterus. Extraocular muscles intact.  HEENT: Head atraumatic, normocephalic. Oropharynx and nasopharynx clear.  NECK:  Supple, no jugular venous distention. No thyroid enlargement, no tenderness.  LUNGS: Normal breath sounds bilaterally, no wheezing, rales,rhonchi or crepitation. No use of accessory muscles of respiration.  CARDIOVASCULAR: Regular rate and rhythm, S1, S2 normal. No murmurs, rubs, or gallops.  ABDOMEN: Soft, nondistended, nontender. Bowel sounds present. No organomegaly or mass.  EXTREMITIES: No pedal edema, cyanosis, or clubbing.  NEUROLOGIC: Cranial nerves II through XII are intact. Muscle strength 5/5 in all extremities. Sensation intact. Gait not checked.  PSYCHIATRIC: The patient is alert and oriented x 3.  Normal affect and good eye  contact. SKIN: No obvious rash, lesion, or ulcer.   LABORATORY PANEL:   CBC Recent Labs  Lab 11/23/21 1632  WBC 8.9  HGB 12.9*  HCT 37.5*  PLT 159   ------------------------------------------------------------------------------------------------------------------  Chemistries  Recent Labs  Lab 11/23/21 1632  NA 139  K 4.1  CL 104  CO2 28  GLUCOSE 120*  BUN 22  CREATININE 1.08  CALCIUM 8.8*   ------------------------------------------------------------------------------------------------------------------  Cardiac Enzymes No results for input(s): "TROPONINI" in the last 168 hours. ------------------------------------------------------------------------------------------------------------------  RADIOLOGY:  DG Chest 2 View  Result Date: 11/23/2021 CLINICAL DATA:  Syncopal episode. EXAM: CHEST - 2 VIEW COMPARISON:  11/15/2021 FINDINGS: The cardiomediastinal contours are normal. The lungs are clear. Pulmonary vasculature is normal. No consolidation, pleural effusion, or pneumothorax. Thoracic spondylosis with diffuse spurring. Chronic changes of the right shoulder. No acute osseous abnormalities are seen. IMPRESSION: No acute chest findings. Electronically Signed   By: 11/17/2021 M.D.   On: 11/23/2021 17:04      IMPRESSION AND PLAN:  Assessment and Plan: * Recurrent  syncope -The patient will be admitted to an observation medical telemetry bed. - We will follow neurochecks every 4 hours for 24 hours. - We will check his orthostatics. - We will monitor for arrhythmias. - We will obtain a 2D echo and bilateral carotid Doppler. - Cardiology and neurology consult will be obtained. - I notified Dr. Juliann Pares and Dr. Selina Cooley about the patient. - Differential diagnoses would include neurally mediated syncope, cardiogenic, neurogenic, arrhythmia related and less likely hypoglycemia.  Parkinson's disease - We will continue his Sinemet IR and CR.  Dyslipidemia We will  continue statin therapy.  BPH (benign prostatic hyperplasia) We will continue Proscar and Flomax.  GERD without esophagitis We will continue PPI therapy.  Anxiety and depression -We will continue his BuSpar and Celexa.  Type 2 diabetes mellitus without complications (HCC) - The patient will be placed on supplemental coverage with NovoLog. - We will hold off metformin for now.   DVT prophylaxis: Lovenox.  Advanced Care Planning:  Code Status:The patient is DNR/DNI.  This was discussed with him and his wife. Family Communication:  The plan of care was discussed in details with the patient (and family). I answered all questions. The patient agreed to proceed with the above mentioned plan. Further management will depend upon hospital course. Disposition Plan: Back to previous home environment Consults called: Cardiology and neurology. All the records are reviewed and case discussed with ED provider.  Status is: Observation   I certify that at the time of admission, it is my clinical judgment that the patient will require inpatient hospital care extending more than 2 midnights.                            Dispo: The patient is from: Home              Anticipated d/c is to: Home              Patient currently is not medically stable to d/c.              Difficult to place patient: No  Hannah Beat M.D on 11/23/2021 at 8:57 PM  [facility]   From 7 PM-7 AM, contact night-coverage [email or web address]  CC: Primary care physician; [facility]

## 2021-11-23 NOTE — Assessment & Plan Note (Addendum)
-  We will continue his BuSpar and Celexa.

## 2021-11-23 NOTE — Assessment & Plan Note (Addendum)
-  The patient will be admitted to an observation medical telemetry bed. - We will follow neurochecks every 4 hours for 24 hours. - We will check his orthostatics. - We will monitor for arrhythmias. - We will obtain a 2D echo and bilateral carotid Doppler. - Cardiology and neurology consult will be obtained. - I notified Dr. Clayborn Bigness and Dr. Quinn Axe about the patient. - Differential diagnoses would include neurally mediated syncope, cardiogenic, neurogenic, arrhythmia related and less likely hypoglycemia.

## 2021-11-24 DIAGNOSIS — R55 Syncope and collapse: Secondary | ICD-10-CM | POA: Diagnosis not present

## 2021-11-24 LAB — CBC
HCT: 35.6 % — ABNORMAL LOW (ref 39.0–52.0)
Hemoglobin: 12.3 g/dL — ABNORMAL LOW (ref 13.0–17.0)
MCH: 32.6 pg (ref 26.0–34.0)
MCHC: 34.6 g/dL (ref 30.0–36.0)
MCV: 94.4 fL (ref 80.0–100.0)
Platelets: 147 10*3/uL — ABNORMAL LOW (ref 150–400)
RBC: 3.77 MIL/uL — ABNORMAL LOW (ref 4.22–5.81)
RDW: 12.7 % (ref 11.5–15.5)
WBC: 6.3 10*3/uL (ref 4.0–10.5)
nRBC: 0 % (ref 0.0–0.2)

## 2021-11-24 LAB — CBG MONITORING, ED: Glucose-Capillary: 128 mg/dL — ABNORMAL HIGH (ref 70–99)

## 2021-11-24 LAB — BASIC METABOLIC PANEL
Anion gap: 5 (ref 5–15)
BUN: 20 mg/dL (ref 8–23)
CO2: 26 mmol/L (ref 22–32)
Calcium: 8.7 mg/dL — ABNORMAL LOW (ref 8.9–10.3)
Chloride: 107 mmol/L (ref 98–111)
Creatinine, Ser: 0.83 mg/dL (ref 0.61–1.24)
GFR, Estimated: >60 mL/min (ref >60)
Glucose, Bld: 95 mg/dL (ref 70–99)
Potassium: 3.6 mmol/L (ref 3.5–5.1)
Sodium: 138 mmol/L (ref 135–145)

## 2021-11-24 NOTE — Progress Notes (Signed)
Patient received from ER , very short stay noted per MD, Iv hydration given and supportive  care. Wife at bedside. No syncopal episodes noted.

## 2021-11-24 NOTE — TOC Progression Note (Addendum)
Transition of Care [facility]) - Progression Note    Patient Details  Name: Johnny Cole MRN: [id number] Date of Birth: Jan 05, 1946  Transition of Care [facility]) CM/SW [place], [place] Phone Number: 11/24/2021, 3:45 PM  Clinical Narrative:        TOC has reached out to [facility], [facility], [facility] and [facility]. These [facility] cannot accept the patient. TOC is waiting to hear back from [facility].   1600: TOC is waiting to hear back from [facility] and [facility].  1626: [facility] cannot accept this patient. He will obtain a referral to outpatient PT/OT from his PCP.  Expected Discharge Plan and Services   Outpatient PT/OT     Expected Discharge Date: 11/24/21                                     Social Determinants of Health (SDOH) Interventions    Readmission Risk Interventions     No data to display

## 2021-11-24 NOTE — Discharge Summary (Signed)
Physician Discharge Summary  Kathrynn SpeedWatson Pilger ZOX:[id number] DOB: [birth year] DOA: 11/23/2021  PCP: [place], [facility]  Admit date: 11/23/2021 Discharge date: 11/24/2021  Admitted From: Home Disposition:  Home with home health  Recommendations for Outpatient Follow-up:  Follow up with PCP in 1-2 weeks Follow up with neurology as scheduled Follow up with cardiology as scheduled  Home Health:Yes PT OT RN aide  Equipment/Devices:None   Discharge Condition:Stable  CODE STATUS:DNR  Diet recommendation: Reg  Brief/Interim Summary:  76 y.o. Caucasian male with medical history significant for anxiety, BPH, dyslipidemia and Parkinson's disease as well as history of syncope, who presented to the emergency room with acute onset of recurrent syncope.The patient had 3 episodes over the last week.  He was seen here in the ER for the first episode on 10/28 with syncope when he had a negative head CT scan and was hydrated and discharged from the ED.Today he had a fall and when he was picked up and was seated in his walker he passed out.  He denies any chest pain or palpitations.  He denied any headache or paresthesias or focal muscle weakness.  No fever or chills.  No dysuria, oliguria or hematuria, urgency or frequency or flank pain.  No cough or wheezing or hemoptysis.   Seen by neurology.  All symptoms can be attributed to autonomic instability in setting of longstanding Parkinsons.  No new medications at this time.  Supine hypertension precludes addition of florinef or similar agent.  Spoke with patient and wife at bedside.  I recommend stopping home Eliquis on dc.  Patient remains a significant fall risk and could potentially have a disasterous bleed if on blood thinners.  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 No evidence of arrythmogenic syncope Seen by PT OT HH services ordered Discontinue Eliquis on DC Stable for DC home FU OP PCP, neurology, cardiology No other changes in home med regimen  Discharge Instructions  Discharge Instructions     Diet - low sodium heart healthy   Complete by: As directed    Increase activity slowly   Complete by: As directed       Allergies as of 11/24/2021   No Known Allergies      Medication List     STOP taking these medications    Eliquis 5 MG Tabs tablet Generic drug: apixaban   FIBER PO   mupirocin ointment 2 % Commonly known as: Bactroban       TAKE these medications    acetaminophen 325 MG tablet Commonly known as: TYLENOL Take 650 mg by mouth every 6 (six) hours as needed.   busPIRone 30 MG tablet Commonly known as: BUSPAR Take 30 mg by mouth 2 (two) times daily.   carbidopa-levodopa 25-250 MG tablet Commonly known as: SINEMET IR Take 1 tablet by mouth 3 (three) times daily.   Carbidopa-Levodopa ER 25-100 MG tablet controlled release Commonly known as: SINEMET CR Take 1 tablet by mouth 5 (five) times daily.   citalopram 40 MG tablet Commonly known as: CELEXA Take 1 tablet by mouth daily.   donepezil 10 MG tablet Commonly known as: ARICEPT Take 10 mg by mouth daily.  finasteride 5 MG tablet Commonly known as: PROSCAR Take 5 mg by mouth daily.   MULTIVITAMIN MEN PO Take by mouth daily.   nystatin powder Commonly known as: MYCOSTATIN/NYSTOP Apply 1 application topically daily.   pantoprazole 40 MG tablet Commonly known as: PROTONIX Take 1 tablet by mouth daily.   simvastatin 20 MG tablet Commonly known as: ZOCOR Take 20 mg by mouth daily at 6 PM.   tamsulosin 0.4 MG Caps capsule Commonly known as: FLOMAX Take 0.4 mg by  mouth daily.        No Known Allergies  Consultations: Neurology   Procedures/Studies: DG Chest 2 View  Result Date: 11/23/2021 CLINICAL DATA:  Syncopal episode. EXAM: CHEST - 2 VIEW COMPARISON:  11/15/2021 FINDINGS: The cardiomediastinal contours are normal. The lungs are clear. Pulmonary vasculature is normal. No consolidation, pleural effusion, or pneumothorax. Thoracic spondylosis with diffuse spurring. Chronic changes of the right shoulder. No acute osseous abnormalities are seen. IMPRESSION: No acute chest findings. Electronically Signed   By: Narda Rutherford M.D.   On: 11/23/2021 17:04   DG Abdomen 1 View  Result Date: 11/15/2021 CLINICAL DATA:  Constipation EXAM: ABDOMEN - 1 VIEW COMPARISON:  None Available. FINDINGS: There is a non obstructive bowel gas pattern. No supine evidence of free air. No organomegaly or suspicious calcification. No acute bony abnormality. Moderate stool burden. Visualized lung bases clear. IMPRESSION: Moderate stool burden.  No acute findings. Electronically Signed   By: Charlett Nose M.D.   On: 11/15/2021 20:09   CT Head Wo Contrast  Result Date: 11/15/2021 CLINICAL DATA:  Head trauma, minor (Age >= 65y) Fall. EXAM: CT HEAD WITHOUT CONTRAST TECHNIQUE: Contiguous axial images were obtained from the base of the skull through the vertex without intravenous contrast. RADIATION DOSE REDUCTION: This exam was performed according to the departmental dose-optimization program which includes automated exposure control, adjustment of the mA and/or kV according to patient size and/or use of iterative reconstruction technique. COMPARISON:  06/20/2019 FINDINGS: Brain: No acute intracranial abnormality. Specifically, no hemorrhage, hydrocephalus, mass lesion, acute infarction, or significant intracranial injury. Vascular: No hyperdense vessel or unexpected calcification. Skull: No acute calvarial abnormality. Sinuses/Orbits: No acute findings Other: None IMPRESSION:  Negative. Electronically Signed   By: Charlett Nose M.D.   On: 11/15/2021 20:08   DG Chest Portable 1 View  Result Date: 11/15/2021 CLINICAL DATA:  Chest pain. EXAM: PORTABLE CHEST 1 VIEW COMPARISON:  None Available. FINDINGS: The heart size and mediastinal contours are within normal limits. Both lungs are clear. The visualized skeletal structures are unremarkable. IMPRESSION: No active disease. Electronically Signed   By: Elgie Collard M.D.   On: 11/15/2021 20:03      Subjective: Seen and examined on day of discharge.  Stable, NAD.  Wife at bedside.  Appropriate for discharge home.  Discharge Exam: Vitals:   11/24/21 0749 11/24/21 1128  BP: (!) 152/80 (!) 168/71  Pulse: 62 63  Resp: 20 18  Temp: 98.5 F (36.9 C) 98 F (36.7 C)  SpO2: 97% 100%   Vitals:   11/24/21 0600 11/24/21 0700 11/24/21 0749 11/24/21 1128  BP: (!) 176/91  (!) 152/80 (!) 168/71  Pulse: 65  62 63  Resp: 19  20 18   Temp:  98.4 F (36.9 C) 98.5 F (36.9 C) 98 F (36.7 C)  TempSrc:  Oral Oral   SpO2: 98%  97% 100%  Weight:      Height:        General: Pt is alert, awake, not  in acute distress Cardiovascular: RRR, S1/S2 +, no rubs, no gallops Respiratory: CTA bilaterally, no wheezing, no rhonchi Abdominal: Soft, NT, ND, bowel sounds + Extremities: no edema, no cyanosis    The results of significant diagnostics from this hospitalization (including imaging, microbiology, ancillary and laboratory) are listed below for reference.     Microbiology: Recent Results (from the past 240 hour(s))  Resp Panel by RT-PCR (Flu A&B, Covid) Anterior Nasal Swab     Status: None   Collection Time: 11/15/21  7:38 PM   Specimen: Anterior Nasal Swab  Result Value Ref Range Status   SARS Coronavirus 2 by RT PCR NEGATIVE NEGATIVE Final    Comment: (NOTE) SARS-CoV-2 target nucleic acids are NOT DETECTED.  The SARS-CoV-2 RNA is generally detectable in upper respiratory specimens during the acute phase of infection.  The lowest concentration of SARS-CoV-2 viral copies this assay can detect is 138 copies/mL. A negative result does not preclude SARS-Cov-2 infection and should not be used as the sole basis for treatment or other patient management decisions. A negative result may occur with  improper specimen collection/handling, submission of specimen other than nasopharyngeal swab, presence of viral mutation(s) within the areas targeted by this assay, and inadequate number of viral copies(<138 copies/mL). A negative result must be combined with clinical observations, patient history, and epidemiological information. The expected result is Negative.  Fact Sheet for Patients:  EntrepreneurPulse.com.au  Fact Sheet for Healthcare Providers:  IncredibleEmployment.be  This test is no t yet approved or cleared by the [place] FDA and  has been authorized for detection and/or diagnosis of SARS-CoV-2 by FDA under an Emergency Use Authorization (EUA). This EUA will remain  in effect (meaning this test can be used) for the duration of the COVID-19 declaration under Section 564(b)(1) of the Act, 21 U.S.C.section 360bbb-3(b)(1), unless the authorization is terminated  or revoked sooner.       Influenza A by PCR NEGATIVE NEGATIVE Final   Influenza B by PCR NEGATIVE NEGATIVE Final    Comment: (NOTE) The Xpert Xpress SARS-CoV-2/FLU/RSV plus assay is intended as an aid in the diagnosis of influenza from Nasopharyngeal swab specimens and should not be used as a sole basis for treatment. Nasal washings and aspirates are unacceptable for Xpert Xpress SARS-CoV-2/FLU/RSV testing.  Fact Sheet for Patients: EntrepreneurPulse.com.au  Fact Sheet for Healthcare Providers: IncredibleEmployment.be  This test is not yet approved or cleared by the [place] FDA and has been authorized for detection and/or diagnosis of SARS-CoV-2 by FDA  under an Emergency Use Authorization (EUA). This EUA will remain in effect (meaning this test can be used) for the duration of the COVID-19 declaration under Section 564(b)(1) of the Act, 21 U.S.C. section 360bbb-3(b)(1), unless the authorization is terminated or revoked.  Performed at [facility], [address] [place], [place] [address]      Labs: BNP (last 3 results) No results for input(s): "BNP" in the last 8760 hours. Basic Metabolic Panel: Recent Labs  Lab 11/23/21 1632 11/24/21 0643  NA 139 138  K 4.1 3.6  CL 104 107  CO2 28 26  GLUCOSE 120* 95  BUN 22 20  CREATININE 1.08 0.83  CALCIUM 8.8* 8.7*   Liver Function Tests: No results for input(s): "AST", "ALT", "ALKPHOS", "BILITOT", "PROT", "ALBUMIN" in the last 168 hours. No results for input(s): "LIPASE", "AMYLASE" in the last 168 hours. No results for input(s): "AMMONIA" in the last 168 hours. CBC: Recent Labs  Lab 11/23/21 1632 11/24/21 0643  WBC  8.9 6.3  HGB 12.9* 12.3*  HCT 37.5* 35.6*  MCV 94.7 94.4  PLT 159 147*   Cardiac Enzymes: No results for input(s): "CKTOTAL", "CKMB", "CKMBINDEX", "TROPONINI" in the last 168 hours. BNP: Invalid input(s): "POCBNP" CBG: Recent Labs  Lab 11/24/21 0606  GLUCAP 128*   D-Dimer No results for input(s): "DDIMER" in the last 72 hours. Hgb A1c No results for input(s): "HGBA1C" in the last 72 hours. Lipid Profile No results for input(s): "CHOL", "HDL", "LDLCALC", "TRIG", "CHOLHDL", "LDLDIRECT" in the last 72 hours. Thyroid function studies No results for input(s): "TSH", "T4TOTAL", "T3FREE", "THYROIDAB" in the last 72 hours.  Invalid input(s): "FREET3" Anemia work up No results for input(s): "VITAMINB12", "FOLATE", "FERRITIN", "TIBC", "IRON", "RETICCTPCT" in the last 72 hours. Urinalysis    Component Value Date/Time   COLORURINE YELLOW (A) 11/15/2021 1938   APPEARANCEUR CLOUDY (A) 11/15/2021 1938   LABSPEC 1.016 11/15/2021 1938   PHURINE 5.0  11/15/2021 1938   GLUCOSEU NEGATIVE 11/15/2021 1938   HGBUR SMALL (A) 11/15/2021 1938   BILIRUBINUR NEGATIVE 11/15/2021 1938   KETONESUR 5 (A) 11/15/2021 1938   PROTEINUR 100 (A) 11/15/2021 1938   NITRITE NEGATIVE 11/15/2021 1938   LEUKOCYTESUR NEGATIVE 11/15/2021 1938   Sepsis Labs Recent Labs  Lab 11/23/21 1632 11/24/21 0643  WBC 8.9 6.3   Microbiology Recent Results (from the past 240 hour(s))  Resp Panel by RT-PCR (Flu A&B, Covid) Anterior Nasal Swab     Status: None   Collection Time: 11/15/21  7:38 PM   Specimen: Anterior Nasal Swab  Result Value Ref Range Status   SARS Coronavirus 2 by RT PCR NEGATIVE NEGATIVE Final    Comment: (NOTE) SARS-CoV-2 target nucleic acids are NOT DETECTED.  The SARS-CoV-2 RNA is generally detectable in upper respiratory specimens during the acute phase of infection. The lowest concentration of SARS-CoV-2 viral copies this assay can detect is 138 copies/mL. A negative result does not preclude SARS-Cov-2 infection and should not be used as the sole basis for treatment or other patient management decisions. A negative result may occur with  improper specimen collection/handling, submission of specimen other than nasopharyngeal swab, presence of viral mutation(s) within the areas targeted by this assay, and inadequate number of viral copies(<138 copies/mL). A negative result must be combined with clinical observations, patient history, and epidemiological information. The expected result is Negative.  Fact Sheet for Patients:  BloggerCourse.com  Fact Sheet for Healthcare Providers:  SeriousBroker.it  This test is no t yet approved or cleared by the [place] FDA and  has been authorized for detection and/or diagnosis of SARS-CoV-2 by FDA under an Emergency Use Authorization (EUA). This EUA will remain  in effect (meaning this test can be used) for the duration of the COVID-19  declaration under Section 564(b)(1) of the Act, 21 U.S.C.section 360bbb-3(b)(1), unless the authorization is terminated  or revoked sooner.       Influenza A by PCR NEGATIVE NEGATIVE Final   Influenza B by PCR NEGATIVE NEGATIVE Final    Comment: (NOTE) The Xpert Xpress SARS-CoV-2/FLU/RSV plus assay is intended as an aid in the diagnosis of influenza from Nasopharyngeal swab specimens and should not be used as a sole basis for treatment. Nasal washings and aspirates are unacceptable for Xpert Xpress SARS-CoV-2/FLU/RSV testing.  Fact Sheet for Patients: BloggerCourse.com  Fact Sheet for Healthcare Providers: SeriousBroker.it  This test is not yet approved or cleared by the [place] FDA and has been authorized for detection and/or diagnosis of SARS-CoV-2 by FDA under  an Emergency Use Authorization (EUA). This EUA will remain in effect (meaning this test can be used) for the duration of the COVID-19 declaration under Section 564(b)(1) of the Act, 21 U.S.C. section 360bbb-3(b)(1), unless the authorization is terminated or revoked.  Performed at [facility], [address], [place], [place] [address]      Time coordinating discharge: Over 30 minutes  SIGNED:   Tresa Nuttall, MD  [facility] 11/24/2021, 2:59 PM Pager   If 7PM-7AM, please contact night-coverage

## 2021-11-24 NOTE — Evaluation (Signed)
Occupational Therapy Evaluation Patient Details Name: Johnny Cole MRN: [id number] DOB: 07-01-1945 Today's Date: 11/24/2021   History of Present Illness Pt with 20 year hx of Parkinson's disease. Recent episodes of falling (thought to be orthostatic); 3 times in the past week.   Clinical Impression   Patient received for OT evaluation. See flowsheet below for details of function. Generally, patient requiring MAX A for bed mobility, MIN A for standing (able to stand approx 2 minutes, then needing to sit 2/2 feeling "woozy" and weak), and set up from seated for grooming ADLs (anticipate overall MOD A at this time for ADLs). OT took orthostatics (see flowsheet below); pt not appearing to be orthostatic at this time, but was limited in mobility; not safe to mobilize away from bed at this time. OT discussed pt status with patient and he is willing to work with [facility]; declines SNF at this time; states he feels like he is not far from functional baseline and feels like he and wife can manage at home, although he does endorse feeling strong fear of falling. Patient will benefit from continued OT while in acute care.      Recommendations for follow up therapy are one component of a multi-disciplinary discharge planning process, led by the attending physician.  Recommendations may be updated based on patient status, additional functional criteria and insurance authorization.   Follow Up Recommendations  Home health OT    Assistance Recommended at Discharge Intermittent Supervision/Assistance  Patient can return home with the following A lot of help with walking and/or transfers;A lot of help with bathing/dressing/bathroom;Assistance with cooking/housework    Functional Status Assessment  Patient has had a recent decline in their functional status and demonstrates the ability to make significant improvements in function in a reasonable and predictable amount of time.  Equipment Recommendations  None  recommended by OT (has all needed DME)    Recommendations for Other Services       Precautions / Restrictions Precautions Precautions: Fall Restrictions Weight Bearing Restrictions: No      Mobility Bed Mobility Overal bed mobility: Needs Assistance Bed Mobility: Supine to Sit     Supine to sit: Max assist     General bed mobility comments: pt typically uses lift recliner    Transfers Overall transfer level: Needs assistance Equipment used: Rolling walker (2 wheels) (gait belt for safety) Transfers: Sit to/from Stand Sit to Stand: Min assist                  Balance                                           ADL either performed or assessed with clinical judgement   ADL Overall ADL's : Needs assistance/impaired Eating/Feeding: Set up;Bed level   Grooming: Supervision/safety;Set up;Sitting (edge of bed) Grooming Details (indicate cue type and reason): brushed teeth with set up.             Lower Body Dressing: Min guard (sitting for donning slip-on socks by leaning forward at EOB.)                 General ADL Comments: Pt stood and primofit caught urine as pt urinated (was continent; stated he need to urinate prior to urinating). Pt generally weak, shaky when standing; unsafe to mobilize away from EOB at this time with only one assist/without chair follow  available. Pt typically transfers from lift recliner, so MAX A from semi-reclined in bed for t/f to EOB. MIN A (OT stabilizing RW) for sit to stand with RW at EOB and sidesteps to L at EOB.     Vision Baseline Vision/History: 1 Wears glasses       Perception     Praxis      Pertinent Vitals/Pain Pain Assessment Pain Assessment: 0-10 Pain Score:  (unrated) Pain Location: back; chronic OA. Pain Descriptors / Indicators: Aching Pain Intervention(s): Repositioned     Hand Dominance     Extremity/Trunk Assessment Upper Extremity Assessment Upper Extremity Assessment:  Generalized weakness (tremors (baseline; 2/2 parkinsons))   Lower Extremity Assessment Lower Extremity Assessment: Defer to PT evaluation;Generalized weakness       Communication Communication Communication: Other (comment);No difficulties (Pt states that sometimes his voice gets weak; today voice is strong during assessment.)   Cognition Arousal/Alertness: Awake/alert Behavior During Therapy: WFL for tasks assessed/performed (slightly impulsive; OT cues to wait for safety.) Overall Cognitive Status: Within Functional Limits for tasks assessed                                 General Comments: Very pleasant, follows all cues appropriately.     General Comments  Orthostatics taken during session. Semi-reclined 153/71; seated EOB 148/88; standing 122/90.    Exercises     Shoulder Instructions      Home Living Family/patient expects to be discharged to:: Private residence Living Arrangements: Spouse/significant other Available Help at Discharge: Family Type of Home: House Home Access: Ramped entrance     Home Layout: One level     Bathroom Shower/Tub: Producer, television/film/video: Handicapped height Bathroom Accessibility: Yes How Accessible: Accessible via walker Home Equipment: Rollator (4 wheels);Shower seat;Toilet riser;Grab bars - toilet;Grab bars - tub/shower;Wheelchair - manual (his rollator is a "step-to" rollator, where you have to sqeeze the handles to release brakes, and then when you let go brakes engage automatically. Also has lift recliner.)          Prior Functioning/Environment Prior Level of Function : Needs assist       Physical Assist : Mobility (physical);ADLs (physical) Mobility (physical): Bed mobility;Transfers ADLs (physical): Bathing;Dressing;Toileting;IADLs Mobility Comments: Uses step-to walker; multiple falls. Pt sleeps in and uses lift recliner during the day. Multiple falls (3x in past week). ADLs Comments: Wife  assists with shower transfer, donning socks/compression hose, donning brief and shirt; seated grooming MOD (I). Pt dons slip on shoes from seated MOD (I). Pt enjoys church activities (gets out 2x/week for church friends meetings/meals). Has had difficulty with reading or writing for long periods of time recently (tremors in BIL UE); enjoys using computer; assists with bill paying with wife assist; wife does all other IADLs.        OT Problem List: Decreased strength;Impaired balance (sitting and/or standing)      OT Treatment/Interventions: Self-care/ADL training;Therapeutic exercise;Neuromuscular education;Patient/family education    OT Goals(Current goals can be found in the care plan section) Acute Rehab OT Goals Patient Stated Goal: Stop falling so much OT Goal Formulation: With patient Time For Goal Achievement: 12/08/21 Potential to Achieve Goals: Good ADL Goals Pt Will Perform Upper Body Dressing: with min assist;sitting Pt Will Transfer to Toilet: with min guard assist Pt Will Perform Toileting - Clothing Manipulation and hygiene: with min guard assist  OT Frequency: Min 2X/week    Co-evaluation  AM-PAC OT "6 Clicks" Daily Activity     Outcome Measure Help from another person eating meals?: None Help from another person taking care of personal grooming?: None Help from another person toileting, which includes using toliet, bedpan, or urinal?: A Little Help from another person bathing (including washing, rinsing, drying)?: A Little Help from another person to put on and taking off regular upper body clothing?: A Little Help from another person to put on and taking off regular lower body clothing?: A Lot 6 Click Score: 19   End of Session Equipment Utilized During Treatment: Gait belt;Rolling walker (2 wheels) Nurse Communication: Mobility status  Activity Tolerance: Patient limited by fatigue Patient left: in bed;with bed alarm set  OT Visit Diagnosis:  Other abnormalities of gait and mobility (R26.89);Unsteadiness on feet (R26.81);Repeated falls (R29.6)                Time: 2836-6294 OT Time Calculation (min): 40 min Charges:  OT General Charges $OT Visit: 1 Visit OT Evaluation $OT Eval Moderate Complexity: 1 Mod OT Treatments $Self Care/Home Management : 8-22 mins  Waymon Amato, MS, OTR/L   Vania Rea 11/24/2021, 10:10 AM

## 2021-11-24 NOTE — TOC Transition Note (Signed)
Transition of Care [facility]) - CM/SW Discharge Note   Patient Details  Name: Johnny Cole MRN: [id number] Date of Birth: 1945/05/06  [facility]) CM/SW Contact:  Colen Darling, [place] Phone Number: 11/24/2021, 4:44 PM   Clinical Narrative:     Patient will discharge home with spouse. He will follow up with PCP regarding outpatient OT/PT.  Final next level of care: OP Rehab Barriers to Discharge: Barriers Resolved   Patient Goals and CMS Choice Patient states their goals for this hospitalization and ongoing recovery are:: discharge home   Choice offered to / list presented to : Spouse  Discharge Placement                  Name of family member notified: Billey Chang Patient and family notified of of transfer: 11/24/21  Discharge Plan and Services                   Outpatient OT/PT                  Social Determinants of Health (SDOH) Interventions     Readmission Risk Interventions     No data to display

## 2021-11-24 NOTE — Evaluation (Addendum)
Physical Therapy Evaluation Patient Details Name: Johnny Cole MRN: [id number] DOB: July 28, 1945 Today's Date: 11/24/2021  History of Present Illness  Patient is a 76 year old male with history of  anxiety, BPH, dyslipidemia and Parkinson's disease as well as history of syncope, who presented to the emergency room with acute onset of recurrent syncope with 3 episodes in the past week.  Clinical Impression  Patient was agreeable to PT evaluation. Spouse at the bedside. The patient uses a Parkinson's walker to ambulate at baseline and also has a wheelchair with a ramped entrance to his home. He reports multiple falls recently.   Orthostatic vitals taken during PT evaluation:  BP- Lying Pulse- Lying BP- Sitting Pulse- Sitting BP- Standing at 0 minutes Pulse- Standing at 0 minutes  11/24/21 1030 162/84 59 (!) 166/91 65 131/72 69   Although the patient had a significant drop in BP from sitting to standing, he reports no dizziness with standing. He declined ambulating and has limited activity tolerance for standing at baseline. Educated patient on monitoring for signs of pre-syncope especially with mobility transitions from supine to sitting, sitting to standing, for fall prevention in the home setting. Encouraged AROM of BLE as able to maintain strength. The patient is likely not far from his baseline. Recommend PT follow up while in the hospital to maximize independence and decrease caregiver burden.        Recommendations for follow up therapy are one component of a multi-disciplinary discharge planning process, led by the attending physician.  Recommendations may be updated based on patient status, additional functional criteria and insurance authorization.  Follow Up Recommendations Home health PT      Assistance Recommended at Discharge Intermittent Supervision/Assistance  Patient can return home with the following  A little help with walking and/or transfers;A little help with  bathing/dressing/bathroom;Help with stairs or ramp for entrance;Assist for transportation    Equipment Recommendations None recommended by PT  Recommendations for Other Services       Functional Status Assessment Patient has had a recent decline in their functional status and demonstrates the ability to make significant improvements in function in a reasonable and predictable amount of time.     Precautions / Restrictions Precautions Precautions: Fall Restrictions Weight Bearing Restrictions: No      Mobility  Bed Mobility Overal bed mobility: Needs Assistance Bed Mobility: Supine to Sit, Sit to Supine     Supine to sit: Mod assist Sit to supine: Mod assist   General bed mobility comments: assistance for trunk support to sit upright. assistance for BLE support to return to bed. increased time required to complete tasks    Transfers Overall transfer level: Needs assistance Equipment used: Rolling walker (2 wheels) Transfers: Sit to/from Stand Sit to Stand: Min assist           General transfer comment: lifitng assistance required for standing. educated patient on the importance of taking time with mobility transitions to monitor for signs of pre-syncope. no dizziness is reported with mobility. blood pressure monitored throughout    Ambulation/Gait               General Gait Details: patient declined walking at this time  Stairs            Wheelchair Mobility    Modified Rankin (Stroke Patients Only)       Balance Overall balance assessment: Needs assistance, History of Falls Sitting-balance support: Feet supported Sitting balance-Leahy Scale: Good     Standing balance support: Bilateral  upper extremity supported, Reliant on assistive device for balance Standing balance-Leahy Scale: Fair Standing balance comment: patient able to maintain standing balance with no external support from therapist, stand by assistance for safety                              Pertinent Vitals/Pain Pain Assessment Pain Assessment: Faces Faces Pain Scale: Hurts a little bit Pain Location: back Pain Descriptors / Indicators: Aching Pain Intervention(s): Limited activity within patient's tolerance, Monitored during session, Repositioned    Home Living Family/patient expects to be discharged to:: Private residence Living Arrangements: Spouse/significant other Available Help at Discharge: Family Type of Home: House Home Access: Ramped entrance       Home Layout: One level Home Equipment: Rollator (4 wheels);Shower seat;Toilet riser;Grab bars - toilet;Grab bars - tub/shower;Wheelchair - manual (Parkinson's walker) Additional Comments: sleeps in a lift chair    Prior Function Prior Level of Function : Needs assist       Physical Assist : Mobility (physical);ADLs (physical) Mobility (physical): Transfers (uses a lift chair to stand at home) ADLs (physical): Bathing;Dressing;Toileting;IADLs Mobility Comments: Uses step-to walker; multiple falls. Pt sleeps in and uses lift recliner during the day. Multiple falls (3x in past week). ADLs Comments: Wife assists with shower transfer, donning socks/compression hose, donning brief and shirt; seated grooming MOD (I). Pt dons slip on shoes from seated MOD (I). Pt enjoys church activities (gets out 2x/week for church friends meetings/meals). Has had difficulty with reading or writing for long periods of time recently (tremors in BIL UE); enjoys using computer; assists with bill paying with wife assist; wife does all other IADLs.     Hand Dominance        Extremity/Trunk Assessment   Upper Extremity Assessment Upper Extremity Assessment: Generalized weakness    Lower Extremity Assessment Lower Extremity Assessment: Generalized weakness       Communication   Communication: No difficulties  Cognition Arousal/Alertness: Awake/alert Behavior During Therapy: WFL for tasks  assessed/performed Overall Cognitive Status: Within Functional Limits for tasks assessed                                 General Comments: patient able to follow commands without difficulty        General Comments General comments (skin integrity, edema, etc.): patient educated on fall prevention tips at to use at home including taking time after mobility transitions (supine to sit, sit to stand, etc) and monitoring for sings of pre-syncope. patient also encouraged to perform gentle ROM of UE and LE to maintain strength. also encouraged patient to take his time with standing activity for safety    Exercises     Assessment/Plan    PT Assessment Patient needs continued PT services  PT Problem List Decreased strength;Decreased range of motion;Decreased activity tolerance;Decreased balance;Decreased mobility       PT Treatment Interventions DME instruction;Gait training;Functional mobility training;Therapeutic activities;Therapeutic exercise;Neuromuscular re-education;Balance training;Cognitive remediation;Patient/family education;Wheelchair mobility training    PT Goals (Current goals can be found in the Care Plan section)  Acute Rehab PT Goals Patient Stated Goal: to go home PT Goal Formulation: With patient Time For Goal Achievement: 12/08/21 Potential to Achieve Goals: Good    Frequency Min 2X/week     Co-evaluation               AM-PAC PT "6 Clicks" Mobility  Outcome Measure Help  needed turning from your back to your side while in a flat bed without using bedrails?: A Little Help needed moving from lying on your back to sitting on the side of a flat bed without using bedrails?: A Lot Help needed moving to and from a bed to a chair (including a wheelchair)?: A Little Help needed standing up from a chair using your arms (e.g., wheelchair or bedside chair)?: A Little Help needed to walk in hospital room?: A Little Help needed climbing 3-5 steps with a  railing? : A Lot 6 Click Score: 16    End of Session   Activity Tolerance: Patient limited by fatigue;Patient tolerated treatment well Patient left: in bed;with bed alarm set;with call bell/phone within reach;with family/visitor present;with nursing/sitter in room   PT Visit Diagnosis: Muscle weakness (generalized) (M62.81);History of falling (Z91.81)    Time: [id number] PT Time Calculation (min) (ACUTE ONLY): 26 min   Charges:   PT Evaluation $PT Eval Low Complexity: 1 Low PT Treatments $Therapeutic Activity: 8-22 mins        Minna Merritts, PT, MPT   Percell Locus 11/24/2021, 11:02 AM

## 2021-11-24 NOTE — Progress Notes (Signed)
   11/24/21 1627  TOC Discharge Assessment  Final next level of care OP Rehab  Once discharged, how will the patient get to their discharge location? Family/Friend - Partnered Transport  Barriers to Discharge Barriers Resolved  Patient states their goals for this hospitalization and ongoing recovery are: discharge home  Choice offered to / list presented to  Spouse  Name of family member notified Savien Mamula  Patient and family notified of of transfer 11/24/21

## 2022-06-18 DIAGNOSIS — G20B2 Parkinson's disease with dyskinesia, with fluctuations: Secondary | ICD-10-CM | POA: Diagnosis not present

## 2022-06-18 DIAGNOSIS — G20A1 Parkinson's disease without dyskinesia, without mention of fluctuations: Secondary | ICD-10-CM | POA: Diagnosis not present

## 2022-08-19 DIAGNOSIS — S61411D Laceration without foreign body of right hand, subsequent encounter: Secondary | ICD-10-CM | POA: Diagnosis not present

## 2022-08-19 DIAGNOSIS — Z23 Encounter for immunization: Secondary | ICD-10-CM | POA: Diagnosis not present

## 2022-09-10 DIAGNOSIS — R9431 Abnormal electrocardiogram [ECG] [EKG]: Secondary | ICD-10-CM | POA: Diagnosis not present

## 2022-09-10 DIAGNOSIS — I951 Orthostatic hypotension: Secondary | ICD-10-CM | POA: Diagnosis not present

## 2022-09-10 DIAGNOSIS — R001 Bradycardia, unspecified: Secondary | ICD-10-CM | POA: Diagnosis not present

## 2022-09-10 DIAGNOSIS — R791 Abnormal coagulation profile: Secondary | ICD-10-CM | POA: Diagnosis not present

## 2022-09-10 DIAGNOSIS — R55 Syncope and collapse: Secondary | ICD-10-CM | POA: Diagnosis not present

## 2022-09-10 DIAGNOSIS — R0609 Other forms of dyspnea: Secondary | ICD-10-CM | POA: Diagnosis not present

## 2022-09-10 DIAGNOSIS — I444 Left anterior fascicular block: Secondary | ICD-10-CM | POA: Diagnosis not present

## 2022-09-10 DIAGNOSIS — R0600 Dyspnea, unspecified: Secondary | ICD-10-CM | POA: Diagnosis not present

## 2022-09-16 DIAGNOSIS — W1839XA Other fall on same level, initial encounter: Secondary | ICD-10-CM | POA: Diagnosis not present

## 2022-09-16 DIAGNOSIS — S61411A Laceration without foreign body of right hand, initial encounter: Secondary | ICD-10-CM | POA: Diagnosis not present

## 2022-10-08 DIAGNOSIS — I951 Orthostatic hypotension: Secondary | ICD-10-CM | POA: Diagnosis not present

## 2022-10-08 DIAGNOSIS — G20B2 Parkinson's disease with dyskinesia, with fluctuations: Secondary | ICD-10-CM | POA: Diagnosis not present

## 2022-10-16 DIAGNOSIS — I10 Essential (primary) hypertension: Secondary | ICD-10-CM | POA: Diagnosis not present

## 2022-10-16 DIAGNOSIS — R590 Localized enlarged lymph nodes: Secondary | ICD-10-CM | POA: Diagnosis not present

## 2022-10-16 DIAGNOSIS — S0101XA Laceration without foreign body of scalp, initial encounter: Secondary | ICD-10-CM | POA: Diagnosis not present

## 2022-10-16 DIAGNOSIS — Z043 Encounter for examination and observation following other accident: Secondary | ICD-10-CM | POA: Diagnosis not present

## 2022-10-16 DIAGNOSIS — E119 Type 2 diabetes mellitus without complications: Secondary | ICD-10-CM | POA: Diagnosis not present

## 2022-10-16 DIAGNOSIS — E785 Hyperlipidemia, unspecified: Secondary | ICD-10-CM | POA: Diagnosis not present

## 2022-10-16 DIAGNOSIS — S0990XA Unspecified injury of head, initial encounter: Secondary | ICD-10-CM | POA: Diagnosis not present

## 2022-10-16 DIAGNOSIS — G20A1 Parkinson's disease without dyskinesia, without mention of fluctuations: Secondary | ICD-10-CM | POA: Diagnosis not present

## 2022-10-22 DIAGNOSIS — E7800 Pure hypercholesterolemia, unspecified: Secondary | ICD-10-CM | POA: Diagnosis not present

## 2022-10-22 DIAGNOSIS — I480 Paroxysmal atrial fibrillation: Secondary | ICD-10-CM | POA: Diagnosis not present

## 2022-10-22 DIAGNOSIS — R55 Syncope and collapse: Secondary | ICD-10-CM | POA: Diagnosis not present

## 2022-10-22 DIAGNOSIS — I10 Essential (primary) hypertension: Secondary | ICD-10-CM | POA: Diagnosis not present

## 2022-11-01 ENCOUNTER — Emergency Department
Admission: EM | Admit: 2022-11-01 | Discharge: 2022-11-01 | Disposition: A | Discharge status: Home / Self Care | Payer: Medicare Other | Attending: Emergency Medicine | Admitting: Emergency Medicine

## 2022-11-01 ENCOUNTER — Other Ambulatory Visit: Payer: Self-pay

## 2022-11-01 DIAGNOSIS — R0902 Hypoxemia: Secondary | ICD-10-CM | POA: Diagnosis not present

## 2022-11-01 DIAGNOSIS — Z743 Need for continuous supervision: Secondary | ICD-10-CM | POA: Diagnosis not present

## 2022-11-01 DIAGNOSIS — G20A2 Parkinson's disease without dyskinesia, with fluctuations: Secondary | ICD-10-CM | POA: Insufficient documentation

## 2022-11-01 DIAGNOSIS — R55 Syncope and collapse: Secondary | ICD-10-CM | POA: Diagnosis not present

## 2022-11-01 DIAGNOSIS — R404 Transient alteration of awareness: Secondary | ICD-10-CM | POA: Diagnosis not present

## 2022-11-01 DIAGNOSIS — R6889 Other general symptoms and signs: Secondary | ICD-10-CM | POA: Diagnosis not present

## 2022-11-01 LAB — BASIC METABOLIC PANEL
Anion gap: 11 (ref 5–15)
BUN: 20 mg/dL (ref 8–23)
CO2: 19 mmol/L — ABNORMAL LOW (ref 22–32)
Calcium: 7.2 mg/dL — ABNORMAL LOW (ref 8.9–10.3)
Chloride: 108 mmol/L (ref 98–111)
Creatinine, Ser: 0.79 mg/dL (ref 0.61–1.24)
GFR, Estimated: >60 mL/min (ref >60)
Glucose, Bld: 126 mg/dL — ABNORMAL HIGH (ref 70–99)
Potassium: 3.4 mmol/L — ABNORMAL LOW (ref 3.5–5.1)
Sodium: 138 mmol/L (ref 135–145)

## 2022-11-01 LAB — CBC
HCT: 37.3 % — ABNORMAL LOW (ref 39.0–52.0)
Hemoglobin: 12.6 g/dL — ABNORMAL LOW (ref 13.0–17.0)
MCH: 33.2 pg (ref 26.0–34.0)
MCHC: 33.8 g/dL (ref 30.0–36.0)
MCV: 98.2 fL (ref 80.0–100.0)
Platelets: 145 10*3/uL — ABNORMAL LOW (ref 150–400)
RBC: 3.8 MIL/uL — ABNORMAL LOW (ref 4.22–5.81)
RDW: 12.3 % (ref 11.5–15.5)
WBC: 6.1 10*3/uL (ref 4.0–10.5)
nRBC: 0 % (ref 0.0–0.2)

## 2022-11-01 NOTE — ED Triage Notes (Signed)
Per EMS, pt has returned to baseline. Pt has parkinson's. EMS sts that wife told EMS that pt has been having syncopal episodes, which is normal for him.

## 2022-11-01 NOTE — ED Provider Notes (Signed)
[facility] Provider Note    Event Date/Time   First MD Initiated Contact with Patient 11/01/22 1738     (approximate)   History   Chief Complaint: Loss of Consciousness   HPI  Johnny Cole is a 77 y.o. male with a history of Parkinson's disease and recurrent syncope who comes to the ED today due to syncope today.  He was in his usual state of health, was sitting on his walker, when he had a passing out episode as per his usual.  It lasted a low bit longer than usual, so his wife wanted him to be evaluated today.  He has since regained consciousness and returned to his normal self.  She has no other concerns.  Prior to this he was asymptomatic, compliant with medication regimen, eating, no GI losses.  He has seen cardiology and neurology who have both advised that the recurrent syncope is part of his Parkinson's disease process.  He is wearing a heart monitor.  His blood pressure is known to fluctuate substantially, and he is intentionally not on antihypertensives.  Patient did not fall or sustain any injury/blunt trauma.     Physical Exam   Triage Vital Signs: ED Triage Vitals  Encounter Vitals Group     BP 11/01/22 1602 (!) 140/80     Systolic BP Percentile --      Diastolic BP Percentile --      Pulse Rate 11/01/22 1602 66     Resp 11/01/22 1602 16     Temp 11/01/22 1602 97.7 F (36.5 C)     Temp Source 11/01/22 1602 Oral     SpO2 11/01/22 1602 96 %     Weight 11/01/22 1547 213 lb 13.5 oz (97 kg)     Height 11/01/22 1547 5\' 6"  (1.676 m)     Head Circumference --      Peak Flow --      Pain Score 11/01/22 1547 5     Pain Loc --      Pain Education --      Exclude from Growth Chart --     Most recent vital signs: Vitals:   11/01/22 1602  BP: (!) 140/80  Pulse: 66  Resp: 16  Temp: 97.7 F (36.5 C)  SpO2: 96%    General: Awake, no distress.  CV:  Good peripheral perfusion.  Regular rate and rhythm.  Normal distal  pulses Resp:  Normal effort.  Clear to auscultation bilaterally Abd:  No distention.  Soft nontender Other:  No signs of trauma.  Anisocoria which is chronic per spouse.   ED Results / Procedures / Treatments   Labs (all labs ordered are listed, but only abnormal results are displayed) Labs Reviewed  BASIC METABOLIC PANEL - Abnormal; Notable for the following components:      Result Value   Potassium 3.4 (*)    CO2 19 (*)    Glucose, Bld 126 (*)    Calcium 7.2 (*)    All other components within normal limits  CBC - Abnormal; Notable for the following components:   RBC 3.80 (*)    Hemoglobin 12.6 (*)    HCT 37.3 (*)    Platelets 145 (*)    All other components within normal limits  URINALYSIS, ROUTINE W REFLEX MICROSCOPIC  CBG MONITORING, ED     EKG Interpreted by me Normal sinus rhythm rate of 70.  Normal axis intervals QRS ST segments and T waves   RADIOLOGY  PROCEDURES:  Procedures   MEDICATIONS ORDERED IN ED: Medications - No data to display   IMPRESSION / MDM / ASSESSMENT AND PLAN / ED COURSE  I reviewed the triage vital signs and the nursing notes.  DDx: Dehydration, AKI, electrolyte abnormality, anemia, recurrent syncope  Patient's presentation is most consistent with acute presentation with potential threat to life or bodily function.  Patient presents with syncope, without any acute prodromal symptoms or aftereffects.  No trauma.  Otherwise in his usual state of health.  Back to baseline.  Vital signs are reassuring, labs and EKG unremarkable.  Spouse comfortable with discharge home at this point, continued outpatient follow-up. Considering the patient's symptoms, medical history, and physical examination today, I have low suspicion for ACS, PE, TAD, pneumothorax, carditis, mediastinitis, pneumonia, CHF, or sepsis. No sign of stroke, infection, or shock.       FINAL CLINICAL IMPRESSION(S) / ED DIAGNOSES   Final diagnoses:  Syncope, unspecified  syncope type  Parkinson's disease with fluctuating manifestations, unspecified whether dyskinesia present (HCC)     Rx / DC Orders   ED Discharge Orders     None        Note:  This document was prepared using Dragon voice recognition software and may include unintentional dictation errors.   Sharman Cheek, MD 11/01/22 [phone]

## 2022-11-01 NOTE — ED Notes (Signed)
EDP at bedside examining pt.

## 2022-11-02 DIAGNOSIS — I951 Orthostatic hypotension: Secondary | ICD-10-CM | POA: Diagnosis not present

## 2022-11-02 DIAGNOSIS — M7042 Prepatellar bursitis, left knee: Secondary | ICD-10-CM | POA: Diagnosis not present

## 2022-11-02 DIAGNOSIS — I480 Paroxysmal atrial fibrillation: Secondary | ICD-10-CM | POA: Diagnosis not present

## 2022-11-02 DIAGNOSIS — G20B1 Parkinson's disease with dyskinesia, without mention of fluctuations: Secondary | ICD-10-CM | POA: Diagnosis not present

## 2022-11-02 DIAGNOSIS — G309 Alzheimer's disease, unspecified: Secondary | ICD-10-CM | POA: Diagnosis not present

## 2022-11-02 DIAGNOSIS — Z48812 Encounter for surgical aftercare following surgery on the circulatory system: Secondary | ICD-10-CM | POA: Diagnosis not present

## 2022-11-02 DIAGNOSIS — Z9181 History of falling: Secondary | ICD-10-CM | POA: Diagnosis not present

## 2022-11-02 DIAGNOSIS — G20A1 Parkinson's disease without dyskinesia, without mention of fluctuations: Secondary | ICD-10-CM | POA: Diagnosis not present

## 2022-11-02 DIAGNOSIS — E119 Type 2 diabetes mellitus without complications: Secondary | ICD-10-CM | POA: Diagnosis not present

## 2022-11-02 DIAGNOSIS — G20A2 Parkinson's disease without dyskinesia, with fluctuations: Secondary | ICD-10-CM | POA: Diagnosis not present

## 2022-11-02 DIAGNOSIS — E7800 Pure hypercholesterolemia, unspecified: Secondary | ICD-10-CM | POA: Diagnosis not present

## 2022-11-02 DIAGNOSIS — Z66 Do not resuscitate: Secondary | ICD-10-CM | POA: Diagnosis not present

## 2022-11-02 DIAGNOSIS — K117 Disturbances of salivary secretion: Secondary | ICD-10-CM | POA: Diagnosis not present

## 2022-11-02 DIAGNOSIS — Z79899 Other long term (current) drug therapy: Secondary | ICD-10-CM | POA: Diagnosis not present

## 2022-11-02 DIAGNOSIS — I10 Essential (primary) hypertension: Secondary | ICD-10-CM | POA: Diagnosis not present

## 2022-11-02 DIAGNOSIS — D696 Thrombocytopenia, unspecified: Secondary | ICD-10-CM | POA: Diagnosis not present

## 2022-11-02 DIAGNOSIS — Z950 Presence of cardiac pacemaker: Secondary | ICD-10-CM | POA: Diagnosis not present

## 2022-11-02 DIAGNOSIS — G4700 Insomnia, unspecified: Secondary | ICD-10-CM | POA: Diagnosis not present

## 2022-11-02 DIAGNOSIS — I455 Other specified heart block: Secondary | ICD-10-CM | POA: Diagnosis not present

## 2022-11-02 DIAGNOSIS — E785 Hyperlipidemia, unspecified: Secondary | ICD-10-CM | POA: Diagnosis not present

## 2022-11-02 DIAGNOSIS — R296 Repeated falls: Secondary | ICD-10-CM | POA: Diagnosis not present

## 2022-11-02 DIAGNOSIS — K5900 Constipation, unspecified: Secondary | ICD-10-CM | POA: Diagnosis not present

## 2022-11-02 DIAGNOSIS — M1712 Unilateral primary osteoarthritis, left knee: Secondary | ICD-10-CM | POA: Diagnosis not present

## 2022-11-02 DIAGNOSIS — R001 Bradycardia, unspecified: Secondary | ICD-10-CM | POA: Diagnosis not present

## 2022-11-02 DIAGNOSIS — B49 Unspecified mycosis: Secondary | ICD-10-CM | POA: Diagnosis not present

## 2022-11-02 DIAGNOSIS — G20B2 Parkinson's disease with dyskinesia, with fluctuations: Secondary | ICD-10-CM | POA: Diagnosis not present

## 2022-11-02 DIAGNOSIS — R55 Syncope and collapse: Secondary | ICD-10-CM | POA: Diagnosis not present

## 2022-11-02 DIAGNOSIS — Z743 Need for continuous supervision: Secondary | ICD-10-CM | POA: Diagnosis not present

## 2022-11-02 DIAGNOSIS — Z006 Encounter for examination for normal comparison and control in clinical research program: Secondary | ICD-10-CM | POA: Diagnosis not present

## 2022-11-02 DIAGNOSIS — G904 Autonomic dysreflexia: Secondary | ICD-10-CM | POA: Diagnosis not present

## 2022-11-03 DIAGNOSIS — Z950 Presence of cardiac pacemaker: Secondary | ICD-10-CM | POA: Diagnosis not present

## 2022-11-11 DIAGNOSIS — Z950 Presence of cardiac pacemaker: Secondary | ICD-10-CM | POA: Diagnosis not present

## 2022-11-11 DIAGNOSIS — E785 Hyperlipidemia, unspecified: Secondary | ICD-10-CM | POA: Diagnosis not present

## 2022-11-11 DIAGNOSIS — G20A2 Parkinson's disease without dyskinesia, with fluctuations: Secondary | ICD-10-CM | POA: Diagnosis not present

## 2022-11-11 DIAGNOSIS — R5381 Other malaise: Secondary | ICD-10-CM | POA: Diagnosis not present

## 2022-11-11 DIAGNOSIS — M7042 Prepatellar bursitis, left knee: Secondary | ICD-10-CM | POA: Diagnosis not present

## 2022-11-11 DIAGNOSIS — I455 Other specified heart block: Secondary | ICD-10-CM | POA: Diagnosis not present

## 2022-11-11 DIAGNOSIS — M1712 Unilateral primary osteoarthritis, left knee: Secondary | ICD-10-CM | POA: Diagnosis not present

## 2022-11-11 DIAGNOSIS — Z87898 Personal history of other specified conditions: Secondary | ICD-10-CM | POA: Diagnosis not present

## 2022-11-11 DIAGNOSIS — G20B2 Parkinson's disease with dyskinesia, with fluctuations: Secondary | ICD-10-CM | POA: Diagnosis not present

## 2022-11-11 DIAGNOSIS — G20A1 Parkinson's disease without dyskinesia, without mention of fluctuations: Secondary | ICD-10-CM | POA: Diagnosis not present

## 2022-11-11 DIAGNOSIS — I10 Essential (primary) hypertension: Secondary | ICD-10-CM | POA: Diagnosis not present

## 2022-11-11 DIAGNOSIS — R296 Repeated falls: Secondary | ICD-10-CM | POA: Diagnosis not present

## 2022-11-11 DIAGNOSIS — K5900 Constipation, unspecified: Secondary | ICD-10-CM | POA: Diagnosis not present

## 2022-11-11 DIAGNOSIS — D696 Thrombocytopenia, unspecified: Secondary | ICD-10-CM | POA: Diagnosis not present

## 2022-11-11 DIAGNOSIS — R55 Syncope and collapse: Secondary | ICD-10-CM | POA: Diagnosis not present

## 2022-11-11 DIAGNOSIS — I951 Orthostatic hypotension: Secondary | ICD-10-CM | POA: Diagnosis not present

## 2022-11-11 DIAGNOSIS — G4700 Insomnia, unspecified: Secondary | ICD-10-CM | POA: Diagnosis not present

## 2022-11-11 DIAGNOSIS — Z9181 History of falling: Secondary | ICD-10-CM | POA: Diagnosis not present

## 2022-11-11 DIAGNOSIS — R001 Bradycardia, unspecified: Secondary | ICD-10-CM | POA: Diagnosis not present

## 2022-11-11 DIAGNOSIS — R079 Chest pain, unspecified: Secondary | ICD-10-CM | POA: Diagnosis not present

## 2022-11-11 DIAGNOSIS — Z743 Need for continuous supervision: Secondary | ICD-10-CM | POA: Diagnosis not present

## 2022-11-11 DIAGNOSIS — I480 Paroxysmal atrial fibrillation: Secondary | ICD-10-CM | POA: Diagnosis not present

## 2022-11-11 DIAGNOSIS — K117 Disturbances of salivary secretion: Secondary | ICD-10-CM | POA: Diagnosis not present

## 2022-11-11 DIAGNOSIS — G309 Alzheimer's disease, unspecified: Secondary | ICD-10-CM | POA: Diagnosis not present

## 2022-11-11 DIAGNOSIS — Z48812 Encounter for surgical aftercare following surgery on the circulatory system: Secondary | ICD-10-CM | POA: Diagnosis not present

## 2022-11-11 DIAGNOSIS — B49 Unspecified mycosis: Secondary | ICD-10-CM | POA: Diagnosis not present

## 2022-11-11 DIAGNOSIS — E7800 Pure hypercholesterolemia, unspecified: Secondary | ICD-10-CM | POA: Diagnosis not present

## 2022-11-14 DIAGNOSIS — R55 Syncope and collapse: Secondary | ICD-10-CM | POA: Diagnosis not present

## 2022-11-17 DIAGNOSIS — R55 Syncope and collapse: Secondary | ICD-10-CM | POA: Diagnosis not present

## 2022-11-17 DIAGNOSIS — E785 Hyperlipidemia, unspecified: Secondary | ICD-10-CM | POA: Diagnosis not present

## 2022-11-17 DIAGNOSIS — I455 Other specified heart block: Secondary | ICD-10-CM | POA: Diagnosis not present

## 2022-11-17 DIAGNOSIS — R079 Chest pain, unspecified: Secondary | ICD-10-CM | POA: Diagnosis not present

## 2022-11-17 DIAGNOSIS — I10 Essential (primary) hypertension: Secondary | ICD-10-CM | POA: Diagnosis not present

## 2022-11-17 DIAGNOSIS — I480 Paroxysmal atrial fibrillation: Secondary | ICD-10-CM | POA: Diagnosis not present

## 2022-11-17 DIAGNOSIS — Z950 Presence of cardiac pacemaker: Secondary | ICD-10-CM | POA: Diagnosis not present

## 2022-11-17 DIAGNOSIS — R001 Bradycardia, unspecified: Secondary | ICD-10-CM | POA: Diagnosis not present

## 2022-11-17 DIAGNOSIS — Z87898 Personal history of other specified conditions: Secondary | ICD-10-CM | POA: Diagnosis not present

## 2022-11-18 DIAGNOSIS — K117 Disturbances of salivary secretion: Secondary | ICD-10-CM | POA: Diagnosis not present

## 2022-11-18 DIAGNOSIS — R5381 Other malaise: Secondary | ICD-10-CM | POA: Diagnosis not present

## 2022-11-18 DIAGNOSIS — Z950 Presence of cardiac pacemaker: Secondary | ICD-10-CM | POA: Diagnosis not present

## 2022-11-18 DIAGNOSIS — G4700 Insomnia, unspecified: Secondary | ICD-10-CM | POA: Diagnosis not present

## 2022-11-18 DIAGNOSIS — R001 Bradycardia, unspecified: Secondary | ICD-10-CM | POA: Diagnosis not present

## 2022-11-18 DIAGNOSIS — D696 Thrombocytopenia, unspecified: Secondary | ICD-10-CM | POA: Diagnosis not present

## 2022-11-18 DIAGNOSIS — G20A2 Parkinson's disease without dyskinesia, with fluctuations: Secondary | ICD-10-CM | POA: Diagnosis not present

## 2022-11-18 DIAGNOSIS — R55 Syncope and collapse: Secondary | ICD-10-CM | POA: Diagnosis not present

## 2022-11-18 DIAGNOSIS — E785 Hyperlipidemia, unspecified: Secondary | ICD-10-CM | POA: Diagnosis not present

## 2022-11-18 DIAGNOSIS — R296 Repeated falls: Secondary | ICD-10-CM | POA: Diagnosis not present

## 2022-11-30 DIAGNOSIS — R55 Syncope and collapse: Secondary | ICD-10-CM | POA: Diagnosis not present

## 2022-11-30 DIAGNOSIS — G20B2 Parkinson's disease with dyskinesia, with fluctuations: Secondary | ICD-10-CM | POA: Diagnosis not present

## 2022-12-21 DIAGNOSIS — G20B2 Parkinson's disease with dyskinesia, with fluctuations: Secondary | ICD-10-CM | POA: Diagnosis not present

## 2022-12-21 DIAGNOSIS — I951 Orthostatic hypotension: Secondary | ICD-10-CM | POA: Diagnosis not present
